# Patient Record
Sex: Female | Born: 1950 | Race: White | Hispanic: No | Marital: Married | Smoking: Never smoker
Health system: Southern US, Community
[De-identification: ages and names within clinical notes are randomized; demographics above are authoritative.]

## PROBLEM LIST (undated history)

## (undated) DIAGNOSIS — J309 Allergic rhinitis, unspecified: Secondary | ICD-10-CM

## (undated) DIAGNOSIS — M069 Rheumatoid arthritis, unspecified: Secondary | ICD-10-CM

## (undated) DIAGNOSIS — K760 Fatty (change of) liver, not elsewhere classified: Secondary | ICD-10-CM

## (undated) DIAGNOSIS — J849 Interstitial pulmonary disease, unspecified: Secondary | ICD-10-CM

## (undated) DIAGNOSIS — K219 Gastro-esophageal reflux disease without esophagitis: Secondary | ICD-10-CM

## (undated) HISTORY — DX: Interstitial pulmonary disease, unspecified: J84.9

## (undated) HISTORY — PX: BREAST SURGERY: SHX581

## (undated) HISTORY — DX: Allergic rhinitis, unspecified: J30.9

## (undated) HISTORY — DX: Fatty (change of) liver, not elsewhere classified: K76.0

## (undated) HISTORY — PX: FOOT SURGERY: SHX648

## (undated) HISTORY — DX: Gastro-esophageal reflux disease without esophagitis: K21.9

## (undated) HISTORY — DX: Rheumatoid arthritis, unspecified: M06.9

---

## 1998-08-20 ENCOUNTER — Other Ambulatory Visit: Admission: RE | Admit: 1998-08-20 | Discharge: 1998-08-20 | Payer: Self-pay | Admitting: *Deleted

## 1999-09-16 ENCOUNTER — Other Ambulatory Visit: Admission: RE | Admit: 1999-09-16 | Discharge: 1999-09-16 | Payer: Self-pay | Admitting: *Deleted

## 2004-06-03 ENCOUNTER — Ambulatory Visit (HOSPITAL_COMMUNITY): Admission: RE | Admit: 2004-06-03 | Discharge: 2004-06-03 | Payer: Self-pay | Admitting: Unknown Physician Specialty

## 2008-03-14 ENCOUNTER — Emergency Department (HOSPITAL_COMMUNITY): Admission: EM | Admit: 2008-03-14 | Discharge: 2008-03-14 | Payer: Self-pay | Admitting: Emergency Medicine

## 2012-08-02 ENCOUNTER — Encounter: Payer: Self-pay | Admitting: Gastroenterology

## 2012-08-29 ENCOUNTER — Ambulatory Visit: Payer: Self-pay | Admitting: Gastroenterology

## 2015-07-15 ENCOUNTER — Emergency Department (HOSPITAL_COMMUNITY): Payer: BLUE CROSS/BLUE SHIELD

## 2015-07-15 ENCOUNTER — Emergency Department (HOSPITAL_COMMUNITY)
Admission: EM | Admit: 2015-07-15 | Discharge: 2015-07-15 | Disposition: A | Discharge status: Home / Self Care | Payer: BLUE CROSS/BLUE SHIELD | Attending: Emergency Medicine | Admitting: Emergency Medicine

## 2015-07-15 ENCOUNTER — Encounter (HOSPITAL_COMMUNITY): Payer: Self-pay | Admitting: Nurse Practitioner

## 2015-07-15 DIAGNOSIS — R0789 Other chest pain: Secondary | ICD-10-CM | POA: Diagnosis not present

## 2015-07-15 DIAGNOSIS — Z79899 Other long term (current) drug therapy: Secondary | ICD-10-CM | POA: Diagnosis not present

## 2015-07-15 DIAGNOSIS — Z7982 Long term (current) use of aspirin: Secondary | ICD-10-CM | POA: Insufficient documentation

## 2015-07-15 DIAGNOSIS — R079 Chest pain, unspecified: Secondary | ICD-10-CM | POA: Diagnosis present

## 2015-07-15 LAB — BASIC METABOLIC PANEL
ANION GAP: 7 (ref 5–15)
BUN: 23 mg/dL — AB (ref 6–20)
CALCIUM: 9.4 mg/dL (ref 8.9–10.3)
CO2: 29 mmol/L (ref 22–32)
Chloride: 102 mmol/L (ref 101–111)
Creatinine, Ser: 1.41 mg/dL — ABNORMAL HIGH (ref 0.44–1.00)
GFR calc Af Amer: 44 mL/min — ABNORMAL LOW (ref >60)
GFR, EST NON AFRICAN AMERICAN: 38 mL/min — AB (ref >60)
GLUCOSE: 108 mg/dL — AB (ref 65–99)
Potassium: 4.3 mmol/L (ref 3.5–5.1)
Sodium: 138 mmol/L (ref 135–145)

## 2015-07-15 LAB — I-STAT TROPONIN, ED
TROPONIN I, POC: 0 ng/mL (ref 0.00–0.08)
Troponin i, poc: 0 ng/mL (ref 0.00–0.08)

## 2015-07-15 LAB — CBC
HCT: 44.1 % (ref 36.0–46.0)
HEMOGLOBIN: 14.8 g/dL (ref 12.0–15.0)
MCH: 30 pg (ref 26.0–34.0)
MCHC: 33.6 g/dL (ref 30.0–36.0)
MCV: 89.5 fL (ref 78.0–100.0)
Platelets: 335 10*3/uL (ref 150–400)
RBC: 4.93 MIL/uL (ref 3.87–5.11)
RDW: 13.5 % (ref 11.5–15.5)
WBC: 10.1 10*3/uL (ref 4.0–10.5)

## 2015-07-15 MED ORDER — NAPROXEN 500 MG PO TABS: 500.0000 mg | ORAL_TABLET | Freq: Two times a day (BID) | ORAL | Status: DC

## 2015-07-15 MED ORDER — IOPAMIDOL (ISOVUE-370) INJECTION 76%
INTRAVENOUS | Status: AC
  Administered 2015-07-15: 100 mL
  Filled 2015-07-15: qty 100

## 2015-07-15 NOTE — ED Notes (Addendum)
Pt c/o 1 day history intermittent L sided CP. She describes as sharp. Pt reports SOB, nausea. Pt denies cough, fevers, lightheadedness. She c/o numbness in L leg and L arm when waking yesterday and today that resolved once she got up from bed. 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 She is alert and breathing easily

## 2015-07-15 NOTE — Discharge Instructions (Signed)
Return to the ED with any concerns including difficulty breathing, fainting, weakness of arms or legs, decreased level of alertness/lethargy or any other alarming symptoms

## 2015-07-15 NOTE — ED Provider Notes (Signed)
CSN: [id number]     Arrival date & time 07/15/15  1328 History   None    Chief Complaint  Patient presents with  . Chest Pain     (Consider location/radiation/quality/duration/timing/severity/associated sxs/prior Treatment) HPI  Pt presenting with c/o chest pain.  Pain is sharp, intermittent and has been going on since yesterday.  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  She states pain lasts approx 5 minutes and then resolves.  Comes back after a few minutes.  No had no diaphoresis, no nausea.  She went home and went to sleep and when she woke up noticed left arm and left leg numbness and tingling, no weakness.  Some shortness of breath with chest pain.  No leg swelling.  She does have hx of DVT in the past.  No recent travel/trauma/surgery.  No fever or cough.  She tried taking advil without much relief.  Pt does not smoke.  There are no other associated systemic symptoms, there are no other alleviating or modifying factors.   History reviewed. No pertinent past medical history. Past Surgical History  Procedure Laterality Date  . Breast surgery     History reviewed. No pertinent family history. Social History  Substance Use Topics  . Smoking status: Never Smoker   . Smokeless tobacco: None  . Alcohol Use: No   OB History    No data available     Review of Systems  ROS reviewed and all otherwise negative except for mentioned in HPI    Allergies  Penicillins  Home Medications   Prior to Admission medications   Medication Sig Start Date End Date Taking? Authorizing Provider  aspirin 81 MG tablet Take 81 mg by mouth daily.   Yes Historical Provider, MD  EST ESTROGENS-METHYLTEST HS 0.625-1.25 MG tablet Take 1 tablet by mouth at bedtime. 05/03/15  Yes Historical Provider, MD  Fish Oil-Cholecalciferol (FISH OIL + D3 PO) Take 1 capsule by mouth daily.   Yes Historical Provider, MD  lisinopril-hydrochlorothiazide  (PRINZIDE,ZESTORETIC) 10-12.5 MG tablet Take 1 tablet by mouth daily. 07/01/15  Yes Historical Provider, MD  PARoxetine (PAXIL) 20 MG tablet Take 40 mg by mouth daily. 07/10/15  Yes Historical Provider, MD  PAZEO 0.7 % SOLN PLACE 1 DROP INTO BOTH EYES ONCE DAILY 06/26/15  Yes Historical Provider, MD  tiZANidine (ZANAFLEX) 4 MG tablet Take 1 tablet by mouth every 8 (eight) hours as needed. Muscle spasm 07/13/15  Yes Historical Provider, MD  traZODone (DESYREL) 50 MG tablet Take 50 mg by mouth daily. 07/01/15  Yes Historical Provider, MD  VITAMIN E PO Take 1,000 Units by mouth daily.   Yes Historical Provider, MD  naproxen (NAPROSYN) 500 MG tablet Take 1 tablet (500 mg total) by mouth 2 (two) times daily. 07/15/15   Alfonzo Beers, MD   BP 102/55 mmHg  Pulse 74  Temp(Src) 98.7 F (37.1 C) (Oral)  Resp 17  SpO2 96%  Vitals reviewed Physical Exam  Physical Examination: General appearance - alert, well appearing, and in no distress Mental status - alert, oriented to person, place, and time Eyes - no conjunctival injection, no scleral icterus Chest - clear to auscultation, no wheezes, rales or rhonchi, symmetric air entry Heart - normal rate, regular rhythm, normal S1, S2, no murmurs, rubs, clicks or gallops Abdomen - soft, nontender, nondistended, no masses or organomegaly Neurological - alert, oriented, normal speech Extremities - peripheral pulses normal, no pedal edema, no clubbing  or cyanosis Skin - normal coloration and turgor, no rashes  ED Course  Procedures (including critical care time) Labs Review Labs Reviewed  BASIC METABOLIC PANEL - Abnormal; Notable for the following:    Glucose, Bld 108 (*)    BUN 23 (*)    Creatinine, Ser 1.41 (*)    GFR calc non Af Amer 38 (*)    GFR calc Af Amer 44 (*)    All other components within normal limits  CBC  I-STAT TROPOININ, ED  Randolm Idol, ED    Imaging Review Dg Chest 2 View  07/15/2015  CLINICAL DATA:  Shortness of breath and chest  pain EXAM: CHEST  2 VIEW COMPARISON:  June 27, 2013 FINDINGS: There is somewhat generalized interstitial thickening, stable. There is no edema or consolidation. The heart size and pulmonary vascularity are normal. No adenopathy. There is atherosclerotic calcification in the aorta. There is degenerative change in the upper lumbar spine. IMPRESSION: Interstitial thickening, a stable finding likely indicative of chronic inflammatory type change. No frank edema or consolidation. Aortic atherosclerosis noted. Electronically Signed   By: Lowella Grip III M.D.   On: 07/15/2015 14:14   Ct Angio Chest Pe W Or Wo Contrast  07/15/2015  CLINICAL DATA:  Left-sided chest pain. Shortness of breath. Nausea. EXAM: CT ANGIOGRAPHY CHEST WITH CONTRAST TECHNIQUE: Multidetector CT imaging of the chest was performed using the standard protocol during bolus administration of intravenous contrast. Multiplanar CT image reconstructions and MIPs were obtained to evaluate the vascular anatomy. CONTRAST:  100 mL Isovue 370 COMPARISON:  None. FINDINGS: Mediastinum/Lymph Nodes: No pulmonary emboli or thoracic aortic dissection identified. The heart size is normal. No masses or pathologically enlarged lymph nodes identified. Lungs/Pleura: Mild bibasilar atelectasis. No focal consolidation or pleural effusion. No pneumothorax. Upper abdomen: No acute findings. Musculoskeletal: No chest wall mass or suspicious bone lesions identified. Other:  Bilateral breast implants are noted. Review of the MIP images confirms the above findings. IMPRESSION: 1. No evidence of pulmonary embolus. Electronically Signed   By: Kathreen Devoid   On: 07/15/2015 17:35   I have personally reviewed and evaluated these images and lab results as part of my medical decision-making.   EKG Interpretation   Date/Time:  Thursday July 15 2015 13:31:56 EDT Ventricular Rate:  96 PR Interval:  136 QRS Duration: 72 QT Interval:  360 QTC Calculation: 454 R Axis:    37 Text Interpretation:  Normal sinus rhythm Normal ECG No old tracing to  compare Confirmed by [facility]  MD, Jerrell Mangel [phone]) on 07/15/2015 3:41:03 PM      MDM   Final diagnoses:  Chest wall pain    Pt presenting with c/o sharp left sided chest pain, not associated with exertion, no nausea or diaphoresis.  She has a heart score of 2, making her low risk for ACS.  2 sets of troponins were negative, ekg reassuring.  Pain is also reproducible which is most c/w inflammation of chest wall.  Pt does have a hx of DVT so Ct angio chest was obtained this did not show any evidence of PE.  Pt given rx for anti inflammatories.  Discharged with strict return precautions.  Pt agreeable with plan.  Pt has a heart score of 2  Alfonzo Beers, MD 07/15/15 2229

## 2015-07-15 NOTE — ED Notes (Signed)
Chest pain is worse on palpation

## 2017-01-17 DIAGNOSIS — Z6829 Body mass index (BMI) 29.0-29.9, adult: Secondary | ICD-10-CM | POA: Diagnosis not present

## 2017-01-17 DIAGNOSIS — I10 Essential (primary) hypertension: Secondary | ICD-10-CM | POA: Diagnosis not present

## 2017-01-17 DIAGNOSIS — Z87891 Personal history of nicotine dependence: Secondary | ICD-10-CM | POA: Diagnosis not present

## 2017-01-17 DIAGNOSIS — J069 Acute upper respiratory infection, unspecified: Secondary | ICD-10-CM | POA: Diagnosis not present

## 2017-01-17 DIAGNOSIS — E7800 Pure hypercholesterolemia, unspecified: Secondary | ICD-10-CM | POA: Diagnosis not present

## 2017-01-17 DIAGNOSIS — Z299 Encounter for prophylactic measures, unspecified: Secondary | ICD-10-CM | POA: Diagnosis not present

## 2017-01-17 DIAGNOSIS — M069 Rheumatoid arthritis, unspecified: Secondary | ICD-10-CM | POA: Diagnosis not present

## 2017-02-28 DIAGNOSIS — J069 Acute upper respiratory infection, unspecified: Secondary | ICD-10-CM | POA: Diagnosis not present

## 2017-02-28 DIAGNOSIS — Z299 Encounter for prophylactic measures, unspecified: Secondary | ICD-10-CM | POA: Diagnosis not present

## 2017-02-28 DIAGNOSIS — E7800 Pure hypercholesterolemia, unspecified: Secondary | ICD-10-CM | POA: Diagnosis not present

## 2017-02-28 DIAGNOSIS — Z6828 Body mass index (BMI) 28.0-28.9, adult: Secondary | ICD-10-CM | POA: Diagnosis not present

## 2017-02-28 DIAGNOSIS — I10 Essential (primary) hypertension: Secondary | ICD-10-CM | POA: Diagnosis not present

## 2017-02-28 DIAGNOSIS — M069 Rheumatoid arthritis, unspecified: Secondary | ICD-10-CM | POA: Diagnosis not present

## 2017-04-26 ENCOUNTER — Ambulatory Visit (INDEPENDENT_AMBULATORY_CARE_PROVIDER_SITE_OTHER): Payer: Medicare HMO | Admitting: Otolaryngology

## 2017-04-26 DIAGNOSIS — R490 Dysphonia: Secondary | ICD-10-CM | POA: Diagnosis not present

## 2017-04-26 DIAGNOSIS — K219 Gastro-esophageal reflux disease without esophagitis: Secondary | ICD-10-CM | POA: Diagnosis not present

## 2017-05-25 ENCOUNTER — Ambulatory Visit: Payer: Medicare HMO | Admitting: Pulmonary Disease

## 2017-05-25 ENCOUNTER — Encounter: Payer: Self-pay | Admitting: Pulmonary Disease

## 2017-05-25 VITALS — BP 124/68 | HR 82 | Ht 61.0 in | Wt 145.0 lb

## 2017-05-25 DIAGNOSIS — J849 Interstitial pulmonary disease, unspecified: Secondary | ICD-10-CM | POA: Diagnosis not present

## 2017-05-25 DIAGNOSIS — R0602 Shortness of breath: Secondary | ICD-10-CM

## 2017-05-25 NOTE — Patient Instructions (Signed)
We will schedule you for full pulmonary function test and 6-minute walk test I will try to get some more information from Dr. Manuella Ghazi and will await their evaluation from rheumatology regarding your rheumatoid arthritis Follow-up in 1 to 2 months

## 2017-05-25 NOTE — Progress Notes (Signed)
Kimberly Livingston    [id number]    1950/07/03  Primary Care Physician:Shah, Weldon Picking, MD  Referring Physician: Monico Blitz, [address] [place] [place], [place] [address]  Chief complaint: Consult for interstitial lung disease  HPI: 67 year old with history of fatty liver, GERD, hypertension.  Has complains of cough for the past 1 to 2 months.  Symptoms associated with white mucus production, postnasal drip.  Associated with dyspnea on exertion.  Denies symptoms at rest.  No wheezing, fevers, chills, hemoptysis. History noted for swelling, joint pain of metacarpophalangeal and proximal interphalangeal joint associated with morning stiffness which lasts for 1 to 2 hours.  She has seen her primary care physician Dr. Manuella Ghazi and was told that she has rheumatoid arthritis after specific labs were sent.  She has been referred to Dr. Rennis Chris, Rheumatology.  Given prednisone at last office visit in April 2019. Denies any Raynaud's symptoms, rash.  She has difficulty swallowing occasionally and feels that food gets stuck in her esophagus. CT scan noted for progressive interstitial lung disease and she has been referred to pulmonary for further evaluation.  Pets: Has a dog.  Previously had a cockatiel bird 15 years ago.  No farm animals. Occupation: Worked at [employer] for [employer] Exposures: No mold, dampness, hot tub, Jacuzzi Smoking history: No smoking history.  She had been exposed to secondhand smoke Travel history: Lived in [place] and [place] Relevant family history: Significant family history of rheumatoid arthritis.  No family history of lung disease.  Outpatient Encounter Medications as of 05/25/2017  Medication Sig  . albuterol (PROVENTIL) (2.5 MG/3ML) 0.083% nebulizer solution Take 2.5 mg by nebulization every 6 (six) hours as needed for wheezing or shortness of breath.  . dextromethorphan-guaiFENesin (MUCINEX DM) 30-600 MG 12hr tablet Take 1 tablet by mouth 2 (two) times  daily.  . fluticasone (FLONASE) 50 MCG/ACT nasal spray Place 2 sprays into both nostrils daily.  Marland Kitchen omeprazole (PRILOSEC) 20 MG capsule Take 20 mg by mouth daily.  Marland Kitchen PARoxetine (PAXIL) 20 MG tablet Take 40 mg by mouth daily.  Marland Kitchen PAZEO 0.7 % SOLN PLACE 1 DROP INTO BOTH EYES ONCE DAILY  . traZODone (DESYREL) 50 MG tablet Take 50 mg by mouth daily.  Marland Kitchen VITAMIN E PO Take 1,000 Units by mouth daily.  . [DISCONTINUED] aspirin 81 MG tablet Take 81 mg by mouth daily.  . [DISCONTINUED] EST ESTROGENS-METHYLTEST HS 0.625-1.25 MG tablet Take 1 tablet by mouth at bedtime.  . [DISCONTINUED] Fish Oil-Cholecalciferol (FISH OIL + D3 PO) Take 1 capsule by mouth daily.  . [DISCONTINUED] lisinopril-hydrochlorothiazide (PRINZIDE,ZESTORETIC) 10-12.5 MG tablet Take 1 tablet by mouth daily.  . [DISCONTINUED] naproxen (NAPROSYN) 500 MG tablet Take 1 tablet (500 mg total) by mouth 2 (two) times daily.  . [DISCONTINUED] tiZANidine (ZANAFLEX) 4 MG tablet Take 1 tablet by mouth every 8 (eight) hours as needed. Muscle spasm   No facility-administered encounter medications on file as of 05/25/2017.     Allergies as of 05/25/2017 - Review Complete 05/25/2017  Allergen Reaction Noted  . Penicillins Rash 07/15/2015    Past Medical History:  Diagnosis Date  . Allergic rhinitis     Past Surgical History:  Procedure Laterality Date  . BREAST SURGERY    . FOOT SURGERY      Family History  Problem Relation Age of Onset  . Stroke Mother   . Heart disease Father   . Allergic rhinitis Sister   . Allergic rhinitis Brother   .  Heart attack Brother     Social History   Socioeconomic History  . Marital status: Married    Spouse name: Not on file  . Number of children: Not on file  . Years of education: Not on file  . Highest education level: Not on file  Occupational History  . Not on file  Social Needs  . Financial resource strain: Not on file  . Food insecurity:    Worry: Not on file    Inability: Not on  file  . Transportation needs:    Medical: Not on file    Non-medical: Not on file  Tobacco Use  . Smoking status: Never Smoker  . Smokeless tobacco: Never Used  Substance and Sexual Activity  . Alcohol use: No  . Drug use: No  . Sexual activity: Not on file  Lifestyle  . Physical activity:    Days per week: Not on file    Minutes per session: Not on file  . Stress: Not on file  Relationships  . Social connections:    Talks on phone: Not on file    Gets together: Not on file    Attends religious service: Not on file    Active member of club or organization: Not on file    Attends meetings of clubs or organizations: Not on file    Relationship status: Not on file  . Intimate partner violence:    Fear of current or ex partner: Not on file    Emotionally abused: Not on file    Physically abused: Not on file    Forced sexual activity: Not on file  Other Topics Concern  . Not on file  Social History Narrative  . Not on file    Review of systems: Review of Systems  Constitutional: Negative for fever and chills.  HENT: Negative.   Eyes: Negative for blurred vision.  Respiratory: as per HPI  Cardiovascular: Negative for chest pain and palpitations.  Gastrointestinal: Negative for vomiting, diarrhea, blood per rectum. Genitourinary: Negative for dysuria, urgency, frequency and hematuria.  Musculoskeletal: Negative for myalgias, back pain and joint pain.  Skin: Negative for itching and rash.  Neurological: Negative for dizziness, tremors, focal weakness, seizures and loss of consciousness.  Endo/Heme/Allergies: Negative for environmental allergies.  Psychiatric/Behavioral: Negative for depression, suicidal ideas and hallucinations.  All other systems reviewed and are negative.  Physical Exam: Blood pressure 124/68, pulse 82, height 5\' 1"  (1.549 m), weight 145 lb (65.8 kg), SpO2 97 %. Gen:      No acute distress HEENT:  EOMI, sclera anicteric Neck:     No masses; no  thyromegaly Lungs:   Basal crackles CV:         Regular rate and rhythm; no murmurs Abd:      + bowel sounds; soft, non-tender; no palpable masses, no distension Ext:    No edema; adequate peripheral perfusion Skin:      Warm and dry; no rash Neuro: alert and oriented x 3 Psych: normal mood and affect  Data Reviewed: CT chest 06/03/2004-minimal basilar opacities CT chest 07/15/2015- bibasilar groundglass opacities, reticulation CT chest 04/30/2017- progression of subpleural reticulation, groundglass opacities, traction bronchiectasis.  No definite honeycombing.  There is bibasilar predominance I reviewed the images personally  Labs from primary care  04/23/2017 WBC 13.5, hemoglobin 11.3, hematocrit 35.2, platelets 525, eos 11%, absolute eosinophil count 5462 Metabolic panel-BUN/creatinine 23/1.48, panel within normal limits.  Assessment:  Evaluation for interstitial lung disease CT scan reviewed which shows progression  of basal fibrosis with no definite honeycombing.  This looks like fibrotic NSIP.  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   We will await evaluation from rheumatology.  Get connective tissue serology labs from Dr. Manuella Ghazi.  If this does not turn out to be rheumatoid arthritis then we may need to do further evaluation with lung biopsy. Schedule for pulmonary function test and 6-minute walk test.  GERD Symptoms appear well controlled on PPI.  Plan/Recommendations: - Pulmonary function test, 6-minute walk test - Get labs from primary care. - Await evaluation from rheumatology.  Marshell Garfinkel MD [facility] 05/25/2017, 9:16 AM  CC: Monico Blitz, MD

## 2017-05-26 DIAGNOSIS — J849 Interstitial pulmonary disease, unspecified: Secondary | ICD-10-CM | POA: Insufficient documentation

## 2017-06-07 ENCOUNTER — Ambulatory Visit (INDEPENDENT_AMBULATORY_CARE_PROVIDER_SITE_OTHER): Payer: Medicare HMO | Admitting: Otolaryngology

## 2017-06-29 ENCOUNTER — Ambulatory Visit (INDEPENDENT_AMBULATORY_CARE_PROVIDER_SITE_OTHER): Payer: Medicare HMO | Admitting: *Deleted

## 2017-06-29 DIAGNOSIS — R0609 Other forms of dyspnea: Secondary | ICD-10-CM | POA: Diagnosis not present

## 2017-06-29 NOTE — Progress Notes (Signed)
SIX MIN WALK 06/29/2017  Medications Prilosec 20 and Trazodone 50mg  taken at 7:30a  Supplimental Oxygen during Test? (L/min) No  Laps 4  Partial Lap (in Meters) 0  Baseline BP (sitting) 122/70  Baseline Heartrate 91  Baseline Dyspnea (Borg Scale) 3  Baseline Fatigue (Borg Scale) 1  Baseline SPO2 98  BP (sitting) 140/78  Heartrate 106  Dyspnea (Borg Scale) 3  Fatigue (Borg Scale) 1  SPO2 100  BP (sitting) 132/72  Heartrate 94  SPO2 100  Interpretation Leg pain  Distance Completed 192  Tech Comments: pt completed test at slow pace with no desats. c/o leg pain due to Arthritis

## 2017-07-06 ENCOUNTER — Ambulatory Visit (INDEPENDENT_AMBULATORY_CARE_PROVIDER_SITE_OTHER): Payer: Medicare HMO | Admitting: Pulmonary Disease

## 2017-07-06 ENCOUNTER — Encounter: Payer: Self-pay | Admitting: Pulmonary Disease

## 2017-07-06 VITALS — BP 128/82 | HR 87 | Ht 60.24 in | Wt 153.0 lb

## 2017-07-06 DIAGNOSIS — J849 Interstitial pulmonary disease, unspecified: Secondary | ICD-10-CM | POA: Diagnosis not present

## 2017-07-06 DIAGNOSIS — M069 Rheumatoid arthritis, unspecified: Secondary | ICD-10-CM | POA: Diagnosis not present

## 2017-07-06 LAB — PULMONARY FUNCTION TEST
DL/VA % pred: 88 %
DL/VA: 3.79 ml/min/mmHg/L
DLCO unc % pred: 51 %
DLCO unc: 9.8 ml/min/mmHg
FEF 25-75 Post: 3.52 L/sec
FEF 25-75 Pre: 2.26 L/sec
FEF2575-%CHANGE-POST: 55 %
FEF2575-%PRED-POST: 199 %
FEF2575-%PRED-PRE: 128 %
FEV1-%Change-Post: 1 %
FEV1-%PRED-POST: 91 %
FEV1-%PRED-PRE: 89 %
FEV1-PRE: 1.77 L
FEV1-Post: 1.81 L
FEV1FVC-%CHANGE-POST: 2 %
FEV1FVC-%Pred-Pre: 121 %
FEV6-%Change-Post: 0 %
FEV6-%PRED-PRE: 77 %
FEV6-%Pred-Post: 77 %
FEV6-PRE: 1.93 L
FEV6-Post: 1.91 L
FEV6FVC-%Change-Post: 0 %
FEV6FVC-%PRED-PRE: 105 %
FEV6FVC-%Pred-Post: 105 %
FVC-%Change-Post: 0 %
FVC-%PRED-PRE: 74 %
FVC-%Pred-Post: 73 %
FVC-POST: 1.91 L
FVC-PRE: 1.93 L
POST FEV1/FVC RATIO: 94 %
POST FEV6/FVC RATIO: 100 %
Pre FEV1/FVC ratio: 92 %
Pre FEV6/FVC Ratio: 100 %
RV % PRED: 50 %
RV: 1 L
TLC % pred: 65 %
TLC: 2.92 L

## 2017-07-06 MED ORDER — ALBUTEROL SULFATE HFA 108 (90 BASE) MCG/ACT IN AERS: 2.0000 | INHALATION_SPRAY | Freq: Four times a day (QID) | RESPIRATORY_TRACT | 2 refills | Status: DC | PRN

## 2017-07-06 NOTE — Progress Notes (Addendum)
Kimberly Livingston    [id number]    1950/07/16  Primary Care Physician:Shah, Weldon Picking, MD  Referring Physician: Monico Blitz, [address] [place], [place] [address]  Chief complaint:  Follow up for RA- ILD  HPI: 67 year old with history of fatty liver, GERD, hypertension.  Has complains of cough for the past 1 to 2 months.  Symptoms associated with white mucus production, postnasal drip.  Associated with dyspnea on exertion.  Denies symptoms at rest.  No wheezing, fevers, chills, hemoptysis. History noted for swelling, joint pain of metacarpophalangeal and proximal interphalangeal joint associated with morning stiffness which lasts for 1 to 2 hours.  She has seen her primary care physician Dr. Manuella Ghazi and was told that she has rheumatoid arthritis after specific labs were sent.  She has been referred to Dr. Rennis Chris, Rheumatology.  Given prednisone at last office visit in April 2019. Denies any Raynaud's symptoms, rash.  She has difficulty swallowing occasionally and feels that food gets stuck in her esophagus. CT scan noted for progressive interstitial lung disease and she has been referred to pulmonary for further evaluation.  Pets: Has a dog.  Previously had a cockatiel bird 15 years ago.  No farm animals. Occupation: Worked at [employer] for [employer] Exposures: No mold, dampness, hot tub, Jacuzzi Smoking history: No smoking history.  She had been exposed to secondhand smoke Travel history: Lived in [place] and [place] Relevant family history: Significant family history of rheumatoid arthritis.  No family history of lung disease.  Interim history: Seen by Dr. Estanislado Pandy and started on methotrexate for rheumatoid arthritis. Complains of dyspnea on exertion.  Denies any cough, sputum production.   Outpatient Encounter Medications as of 07/06/2017  Medication Sig  . methotrexate (RHEUMATREX) 2.5 MG tablet Take 10 mg by mouth once a week. Caution:Chemotherapy. Protect  from light.  Marland Kitchen omeprazole (PRILOSEC) 20 MG capsule Take 20 mg by mouth daily.  Marland Kitchen PARoxetine (PAXIL) 20 MG tablet Take 40 mg by mouth daily.  Marland Kitchen PAZEO 0.7 % SOLN PLACE 1 DROP INTO BOTH EYES ONCE DAILY  . predniSONE (DELTASONE) 5 MG tablet Take 5-15 mg by mouth daily with breakfast.  . traZODone (DESYREL) 50 MG tablet Take 50 mg by mouth daily.  Marland Kitchen albuterol (PROVENTIL) (2.5 MG/3ML) 0.083% nebulizer solution Take 2.5 mg by nebulization every 6 (six) hours as needed for wheezing or shortness of breath.  . dextromethorphan-guaiFENesin (MUCINEX DM) 30-600 MG 12hr tablet Take 1 tablet by mouth 2 (two) times daily.  Marland Kitchen VITAMIN E PO Take 1,000 Units by mouth daily.  . [DISCONTINUED] fluticasone (FLONASE) 50 MCG/ACT nasal spray Place 2 sprays into both nostrils daily.   No facility-administered encounter medications on file as of 07/06/2017.     Allergies as of 07/06/2017 - Review Complete 07/06/2017  Allergen Reaction Noted  . Penicillins Rash 07/15/2015    Past Medical History:  Diagnosis Date  . Allergic rhinitis     Past Surgical History:  Procedure Laterality Date  . BREAST SURGERY    . FOOT SURGERY      Family History  Problem Relation Age of Onset  . Stroke Mother   . Heart disease Father   . Allergic rhinitis Sister   . Allergic rhinitis Brother   . Heart attack Brother     Social History   Socioeconomic History  . Marital status: Married    Spouse name: Not on file  . Number of children: Not on file  .  Years of education: Not on file  . Highest education level: Not on file  Occupational History  . Not on file  Social Needs  . Financial resource strain: Not on file  . Food insecurity:    Worry: Not on file    Inability: Not on file  . Transportation needs:    Medical: Not on file    Non-medical: Not on file  Tobacco Use  . Smoking status: Never Smoker  . Smokeless tobacco: Never Used  Substance and Sexual Activity  . Alcohol use: No  . Drug use: No  . Sexual  activity: Not on file  Lifestyle  . Physical activity:    Days per week: Not on file    Minutes per session: Not on file  . Stress: Not on file  Relationships  . Social connections:    Talks on phone: Not on file    Gets together: Not on file    Attends religious service: Not on file    Active member of club or organization: Not on file    Attends meetings of clubs or organizations: Not on file    Relationship status: Not on file  . Intimate partner violence:    Fear of current or ex partner: Not on file    Emotionally abused: Not on file    Physically abused: Not on file    Forced sexual activity: Not on file  Other Topics Concern  . Not on file  Social History Narrative  . Not on file    Review of systems: Review of Systems  Constitutional: Negative for fever and chills.  HENT: Negative.   Eyes: Negative for blurred vision.  Respiratory: as per HPI  Cardiovascular: Negative for chest pain and palpitations.  Gastrointestinal: Negative for vomiting, diarrhea, blood per rectum. Genitourinary: Negative for dysuria, urgency, frequency and hematuria.  Musculoskeletal: Negative for myalgias, back pain and joint pain.  Skin: Negative for itching and rash.  Neurological: Negative for dizziness, tremors, focal weakness, seizures and loss of consciousness.  Endo/Heme/Allergies: Negative for environmental allergies.  Psychiatric/Behavioral: Negative for depression, suicidal ideas and hallucinations.  All other systems reviewed and are negative.  Physical Exam: Blood pressure 128/82, pulse 87, height 5' 0.24" (1.53 m), weight 153 lb (69.4 kg), SpO2 98 %. Gen:      No acute distress HEENT:  EOMI, sclera anicteric Neck:     No masses; no thyromegaly Lungs:  Basal crackles CV:         Regular rate and rhythm; no murmurs Abd:      + bowel sounds; soft, non-tender; no palpable masses, no distension Ext:    No edema; adequate peripheral perfusion Skin:      Warm and dry; no  rash Neuro: alert and oriented x 3 Psych: normal mood and affect  Data Reviewed: CT chest 06/03/2004-minimal basilar opacities CT chest 07/15/2015- bibasilar groundglass opacities, reticulation CT chest 04/30/2017- progression of subpleural reticulation, groundglass opacities, traction bronchiectasis.  No definite honeycombing.  There is bibasilar predominance I reviewed the images personally  Labs from primary care  04/23/2017 WBC 13.5, hemoglobin 11.3, hematocrit 35.2, platelets 525, eos 11%, absolute eosinophil count 5361 Metabolic panel-BUN/creatinine 23/1.48, panel within normal limits.  PFTs 07/06/1988 FVC 1.91 [73%], FEV1 1.81 [91%], F/F 94, TLC 65%, DLCO 51% Moderate restriction, severe diffusion defect.  6-minute walk test 07/06/1988 192 m,  post walk heart rate, O2 sat-94, 100%.  Assessment:  RA-ILD CT scan shows progression of basal fibrosis with no definite honeycombing.  This looks like fibrotic NSIP.  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   PFTs reviewed with restriction and diffusion impairment consistent with interstitial lung disease.  We will continue to monitor this Continue albuterol as needed  GERD Symptoms are well controlled on PPI  Plan/Recommendations: - Follow up Pulmonary function test, 6-minute walk test   Marshell Garfinkel MD [facility] 07/06/2017, 2:06 PM  CC: Monico Blitz, MD

## 2017-07-06 NOTE — Patient Instructions (Signed)
Continue on albuterol.  Will order inhaler to be used as needed Continue on Prilosec for acid reflux Follow-up in 6 months with repeat spirometry, diffusion capacity and 6-minute walk test.

## 2017-07-06 NOTE — Progress Notes (Signed)
PFT completed today. 07/06/17

## 2017-09-03 ENCOUNTER — Telehealth: Payer: Self-pay | Admitting: Pulmonary Disease

## 2017-09-03 NOTE — Telephone Encounter (Signed)
Called patient, unable to reach left message to give us a call back. 

## 2017-09-04 NOTE — Telephone Encounter (Signed)
Attempted to call pt. I did not receive an answer. I have left a message for pt to return our call.  

## 2017-09-05 ENCOUNTER — Ambulatory Visit: Payer: Medicare HMO

## 2017-09-05 NOTE — Telephone Encounter (Signed)
We were calling to remind pt of her 6MW appointment. This appointment has been canceled per the pt. Nothing further was needed at this time.

## 2018-01-23 DIAGNOSIS — E663 Overweight: Secondary | ICD-10-CM | POA: Diagnosis not present

## 2018-01-23 DIAGNOSIS — Z79899 Other long term (current) drug therapy: Secondary | ICD-10-CM | POA: Diagnosis not present

## 2018-01-23 DIAGNOSIS — M545 Low back pain: Secondary | ICD-10-CM | POA: Diagnosis not present

## 2018-01-23 DIAGNOSIS — M2550 Pain in unspecified joint: Secondary | ICD-10-CM | POA: Diagnosis not present

## 2018-01-23 DIAGNOSIS — Z6827 Body mass index (BMI) 27.0-27.9, adult: Secondary | ICD-10-CM | POA: Diagnosis not present

## 2018-01-23 DIAGNOSIS — M0579 Rheumatoid arthritis with rheumatoid factor of multiple sites without organ or systems involvement: Secondary | ICD-10-CM | POA: Diagnosis not present

## 2018-02-11 DIAGNOSIS — R69 Illness, unspecified: Secondary | ICD-10-CM | POA: Diagnosis not present

## 2018-02-13 DIAGNOSIS — R69 Illness, unspecified: Secondary | ICD-10-CM | POA: Diagnosis not present

## 2018-02-13 DIAGNOSIS — L602 Onychogryphosis: Secondary | ICD-10-CM | POA: Diagnosis not present

## 2018-02-13 DIAGNOSIS — Z5189 Encounter for other specified aftercare: Secondary | ICD-10-CM | POA: Diagnosis not present

## 2018-02-26 DIAGNOSIS — M0579 Rheumatoid arthritis with rheumatoid factor of multiple sites without organ or systems involvement: Secondary | ICD-10-CM | POA: Diagnosis not present

## 2018-04-08 DIAGNOSIS — M2550 Pain in unspecified joint: Secondary | ICD-10-CM | POA: Diagnosis not present

## 2018-04-08 DIAGNOSIS — Z79899 Other long term (current) drug therapy: Secondary | ICD-10-CM | POA: Diagnosis not present

## 2018-04-08 DIAGNOSIS — M0579 Rheumatoid arthritis with rheumatoid factor of multiple sites without organ or systems involvement: Secondary | ICD-10-CM | POA: Diagnosis not present

## 2018-04-22 DIAGNOSIS — Z6827 Body mass index (BMI) 27.0-27.9, adult: Secondary | ICD-10-CM | POA: Diagnosis not present

## 2018-04-22 DIAGNOSIS — I10 Essential (primary) hypertension: Secondary | ICD-10-CM | POA: Diagnosis not present

## 2018-04-22 DIAGNOSIS — E7800 Pure hypercholesterolemia, unspecified: Secondary | ICD-10-CM | POA: Diagnosis not present

## 2018-04-22 DIAGNOSIS — K219 Gastro-esophageal reflux disease without esophagitis: Secondary | ICD-10-CM | POA: Diagnosis not present

## 2018-04-22 DIAGNOSIS — M069 Rheumatoid arthritis, unspecified: Secondary | ICD-10-CM | POA: Diagnosis not present

## 2018-04-22 DIAGNOSIS — J019 Acute sinusitis, unspecified: Secondary | ICD-10-CM | POA: Diagnosis not present

## 2018-04-22 DIAGNOSIS — Z299 Encounter for prophylactic measures, unspecified: Secondary | ICD-10-CM | POA: Diagnosis not present

## 2018-07-08 DIAGNOSIS — I10 Essential (primary) hypertension: Secondary | ICD-10-CM | POA: Diagnosis not present

## 2018-07-08 DIAGNOSIS — Z6832 Body mass index (BMI) 32.0-32.9, adult: Secondary | ICD-10-CM | POA: Diagnosis not present

## 2018-07-08 DIAGNOSIS — Z299 Encounter for prophylactic measures, unspecified: Secondary | ICD-10-CM | POA: Diagnosis not present

## 2018-07-08 DIAGNOSIS — K219 Gastro-esophageal reflux disease without esophagitis: Secondary | ICD-10-CM | POA: Diagnosis not present

## 2018-07-08 DIAGNOSIS — Z789 Other specified health status: Secondary | ICD-10-CM | POA: Diagnosis not present

## 2018-07-09 DIAGNOSIS — Z79899 Other long term (current) drug therapy: Secondary | ICD-10-CM | POA: Diagnosis not present

## 2018-07-09 DIAGNOSIS — M2550 Pain in unspecified joint: Secondary | ICD-10-CM | POA: Diagnosis not present

## 2018-07-09 DIAGNOSIS — M545 Low back pain: Secondary | ICD-10-CM | POA: Diagnosis not present

## 2018-07-09 DIAGNOSIS — M0579 Rheumatoid arthritis with rheumatoid factor of multiple sites without organ or systems involvement: Secondary | ICD-10-CM | POA: Diagnosis not present

## 2018-07-17 ENCOUNTER — Encounter: Payer: Self-pay | Admitting: Gastroenterology

## 2018-08-06 DIAGNOSIS — M0579 Rheumatoid arthritis with rheumatoid factor of multiple sites without organ or systems involvement: Secondary | ICD-10-CM | POA: Diagnosis not present

## 2018-08-07 DIAGNOSIS — E8881 Metabolic syndrome: Secondary | ICD-10-CM | POA: Diagnosis not present

## 2018-08-07 DIAGNOSIS — Z299 Encounter for prophylactic measures, unspecified: Secondary | ICD-10-CM | POA: Diagnosis not present

## 2018-08-07 DIAGNOSIS — K219 Gastro-esophageal reflux disease without esophagitis: Secondary | ICD-10-CM | POA: Diagnosis not present

## 2018-08-07 DIAGNOSIS — I10 Essential (primary) hypertension: Secondary | ICD-10-CM | POA: Diagnosis not present

## 2018-08-07 DIAGNOSIS — M069 Rheumatoid arthritis, unspecified: Secondary | ICD-10-CM | POA: Diagnosis not present

## 2018-08-07 DIAGNOSIS — Z6832 Body mass index (BMI) 32.0-32.9, adult: Secondary | ICD-10-CM | POA: Diagnosis not present

## 2018-08-15 DIAGNOSIS — R69 Illness, unspecified: Secondary | ICD-10-CM | POA: Diagnosis not present

## 2018-08-22 ENCOUNTER — Ambulatory Visit: Payer: Medicare HMO | Admitting: Gastroenterology

## 2018-09-25 ENCOUNTER — Encounter: Payer: Self-pay | Admitting: Gastroenterology

## 2018-09-25 ENCOUNTER — Ambulatory Visit: Payer: Medicare HMO | Admitting: Gastroenterology

## 2018-09-25 NOTE — Progress Notes (Addendum)
REVIEWED-NO ADDITIONAL RECOMMENDATIONS.Marland Kitchen  Referring Provider: Monico Blitz, MD Primary Care Physician:  Monico Blitz, MD Primary Gastroenterologist:  Dr. Oneida Alar  Chief Complaint  Patient presents with  . Gastroesophageal Reflux    burning in chest/throat, nausea at times    HPI:   Kimberly Livingston is a 68 y.o. female presenting today at the request of Dr. Monico Blitz for GERD.   Today she states she has had reflux symptoms for about 3-4 years. Will have burning in her throat and chest. Feels acid coming up in her throat at times. Taking Protonix she may go all day without symptoms, but at night time she will get reflux and heartburn. Is taking tums at night which will relieve the reflux. Eats a light meal in the evening. Doesn't eat spicy, fried, or fatty foods. Bakes her foods. Sometimes hamburger will trigger symptoms. Other days she will have symptoms regardless of what she eats. Started Protonix 2-3 months ago. Had been on omeprazole 20 mg for about 6 months prior to that. Feels that omeprazole helped her symptoms more than the Protonix does. Would still have some breakthrough symptoms at night, but less frequently. Some nausea with reflux. No vomiting.No unintentional weight loss. Mentions some vague symptoms of dysphagia occurring maybe twice a month for the last several months. Not worsening. Will hang for a shot time then go on down. Notices with breads and crackers. No pill dysphagia. No liquid dysphagia. Not interested in EGD with dilation at this time.   No blood in the stool or black stools. Taking Aleve maybe once or twice a month. No Goody's powders. Has not tried tylenol.   No abdominal pain. BMs daily. Occasional constipation with straining. No diarrhea.   No fever, chills, lightheadedness, dizziness, or pre-syncope. No chest pain, palpitations. Occasional  shortness of breath with exertion. No shortness of breath at rest. Chronic intermittent cough.   Last colonoscopy  about 6 months ago at [facility]. Reports poor prep. States she is supposed to go back again. Will request records.  EGD years ago for acid reflux. States she was ok.    Past Medical History:  Diagnosis Date  . Allergic rhinitis   . Fatty liver   . ILD (interstitial lung disease) ([place])   . Rheumatoid arthritis [facility])     Past Surgical History:  Procedure Laterality Date  . BREAST SURGERY    . FOOT SURGERY      Current Outpatient Medications  Medication Sig Dispense Refill  . albuterol (PROVENTIL HFA;VENTOLIN HFA) 108 (90 Base) MCG/ACT inhaler Inhale 2 puffs into the lungs every 6 (six) hours as needed for wheezing or shortness of breath. 1 Inhaler 2  . albuterol (PROVENTIL) (2.5 MG/3ML) 0.083% nebulizer solution Take 2.5 mg by nebulization every 6 (six) hours as needed for wheezing or shortness of breath.    . calcium carbonate (TUMS - DOSED IN MG ELEMENTAL CALCIUM) 500 MG chewable tablet Chew 1 tablet by mouth at bedtime.    Marland Kitchen dextromethorphan-guaiFENesin (MUCINEX DM) 30-600 MG 12hr tablet Take 1 tablet by mouth 2 (two) times daily.    . folic acid (FOLVITE) 1 MG tablet Take 2 mg by mouth daily.    . methotrexate (RHEUMATREX) 2.5 MG tablet 9 tablets daily    . pantoprazole (PROTONIX) 40 MG tablet Take 40 mg by mouth daily.    Marland Kitchen PAZEO 0.7 % SOLN PLACE 1 DROP INTO BOTH EYES ONCE DAILY  6  . predniSONE (DELTASONE) 5 MG tablet Take 5-15 mg by  mouth daily with breakfast.    . traZODone (DESYREL) 50 MG tablet Take 50 mg by mouth daily.  2  . VITAMIN E PO Take 1,000 Units by mouth daily. When she remembers    . famotidine (PEPCID) 20 MG tablet Take 1 tablet (20 mg total) by mouth at bedtime. 30 tablet 4  . omeprazole (PRILOSEC) 40 MG capsule Take 1 capsule (40 mg total) by mouth daily. 30 capsule 4   No current facility-administered medications for this visit.     Allergies as of 09/26/2018 - Review Complete 09/26/2018  Allergen Reaction Noted  . Penicillins Rash 07/15/2015    Family  History  Problem Relation Age of Onset  . Stroke Mother   . Heart disease Father   . Allergic rhinitis Sister   . Allergic rhinitis Brother   . Heart attack Brother   . Colon cancer Neg Hx   . Colon polyps Neg Hx     Social History   Socioeconomic History  . Marital status: Married    Spouse name: Not on file  . Number of children: Not on file  . Years of education: Not on file  . Highest education level: Not on file  Occupational History  . Not on file  Social Needs  . Financial resource strain: Not on file  . Food insecurity    Worry: Not on file    Inability: Not on file  . Transportation needs    Medical: Not on file    Non-medical: Not on file  Tobacco Use  . Smoking status: Never Smoker  . Smokeless tobacco: Never Used  Substance and Sexual Activity  . Alcohol use: No  . Drug use: No  . Sexual activity: Not on file  Lifestyle  . Physical activity    Days per week: Not on file    Minutes per session: Not on file  . Stress: Not on file  Relationships  . Social Herbalist on phone: Not on file    Gets together: Not on file    Attends religious service: Not on file    Active member of club or organization: Not on file    Attends meetings of clubs or organizations: Not on file    Relationship status: Not on file  . Intimate partner violence    Fear of current or ex partner: Not on file    Emotionally abused: Not on file    Physically abused: Not on file    Forced sexual activity: Not on file  Other Topics Concern  . Not on file  Social History Narrative  . Not on file    Review of Systems: Gen: See HPI CV: See HPI  Resp: See HPI GI: See HPI GU : Denies urinary burning, urinary frequency, urinary hesitancy MS: Has Rheumatoid Arthritis and hurts all over.  Derm: Denies rash Psych: Denies depression, anxiety Heme: Denies bruising, bleeding  Physical Exam: BP (!) 147/79   Pulse (!) 108   Temp (!) 97.1 F (36.2 C) (Oral)   Wt 169 lb  3.2 oz (76.7 kg)   BMI 32.79 kg/m  General:   Alert and oriented. Well-nourished and well-developed. Changing position a lot as she is reporting diffuse joint pain today secondary to her arthritis.  Head:  Normocephalic and atraumatic. Eyes:  Without icterus, sclera clear and conjunctiva pink.  Ears:  Normal auditory acuity. Nose:  No deformity, discharge,  or lesions. Lungs:  Some crackles heard best in the lower  lungs bilaterally. Likely related to her ILD. No wheezes, or rhonchi. No distress.  Heart:  S1, S2 present without murmurs appreciated.  Abdomen:  +BS, soft, non-distended. Mild diffuse tenderness to deep palpation. States it feels a little sore. Not worse in any specific region. No HSM noted. No guarding or rebound. No masses appreciated.  Rectal:  Deferred  Msk:  Symmetrical without gross deformities. Normal posture. Extremities:  Without edema. Neurologic:  Alert and  oriented x4;  grossly normal neurologically. Skin:  Intact without significant lesions or rashes.  Psych: Normal mood and affect.

## 2018-09-26 ENCOUNTER — Encounter: Payer: Self-pay | Admitting: Gastroenterology

## 2018-09-26 ENCOUNTER — Ambulatory Visit (INDEPENDENT_AMBULATORY_CARE_PROVIDER_SITE_OTHER): Payer: Medicare HMO | Admitting: Gastroenterology

## 2018-09-26 ENCOUNTER — Other Ambulatory Visit: Payer: Self-pay

## 2018-09-26 ENCOUNTER — Telehealth: Payer: Self-pay | Admitting: Gastroenterology

## 2018-09-26 VITALS — BP 147/79 | HR 108 | Temp 97.1°F | Wt 169.2 lb

## 2018-09-26 DIAGNOSIS — K219 Gastro-esophageal reflux disease without esophagitis: Secondary | ICD-10-CM

## 2018-09-26 DIAGNOSIS — R1310 Dysphagia, unspecified: Secondary | ICD-10-CM | POA: Diagnosis not present

## 2018-09-26 DIAGNOSIS — K5900 Constipation, unspecified: Secondary | ICD-10-CM | POA: Diagnosis not present

## 2018-09-26 MED ORDER — OMEPRAZOLE 40 MG PO CPDR: 40.0000 mg | DELAYED_RELEASE_CAPSULE | Freq: Every day | ORAL | 4 refills | Status: DC

## 2018-09-26 MED ORDER — FAMOTIDINE 20 MG PO TABS: 20.0000 mg | ORAL_TABLET | Freq: Every day | ORAL | 4 refills | Status: DC

## 2018-09-26 NOTE — Assessment & Plan Note (Signed)
Typically BMs daily. Constipation occasionally with straining. Add benefiber or metamucil daily.

## 2018-09-26 NOTE — Patient Instructions (Addendum)
Please stop taking Protonix 40 mg and start taking Omeprazole 40 mg daily 30 minutes before breakfast.  Start taking Pepcid every night before bed.   Please see GERD handout below regarding dietary changes. Avoid spicy, fatty, and fried foods. Avoid carbonated beverages and caffeine.   Please add metamucil or benefiber daily to help with your occasional constipation.   We will see you back in 4 weeks. Please call if concerns prior.   Aliene Altes, PA-C [facility]   Food Choices for Gastroesophageal Reflux Disease, Adult When you have gastroesophageal reflux disease (GERD), the foods you eat and your eating habits are very important. Choosing the right foods can help ease your discomfort. Think about working with a nutrition specialist (dietitian) to help you make good choices. What are tips for following this plan?  Meals  Choose healthy foods that are low in fat, such as fruits, vegetables, whole grains, low-fat dairy products, and lean meat, fish, and poultry.  Eat small meals often instead of 3 large meals a day. Eat your meals slowly, and in a place where you are relaxed. Avoid bending over or lying down until 2-3 hours after eating.  Avoid eating meals 2-3 hours before bed.  Avoid drinking a lot of liquid with meals.  Cook foods using methods other than frying. Bake, grill, or broil food instead.  Avoid or limit: ? Chocolate. ? Peppermint or spearmint. ? Alcohol. ? Pepper. ? Black and decaffeinated coffee. ? Black and decaffeinated tea. ? Bubbly (carbonated) soft drinks. ? Caffeinated energy drinks and soft drinks.  Limit high-fat foods such as: ? Fatty meat or fried foods. ? Whole milk, cream, butter, or ice cream. ? Nuts and nut butters. ? Pastries, donuts, and sweets made with butter or shortening.  Avoid foods that cause symptoms. These foods may be different for everyone. Common foods that cause symptoms include: ? Tomatoes. ? Oranges,  lemons, and limes. ? Peppers. ? Spicy food. ? Onions and garlic. ? Vinegar. Lifestyle  Maintain a healthy weight. Ask your doctor what weight is healthy for you. If you need to lose weight, work with your doctor to do so safely.  Exercise for at least 30 minutes for 5 or more days each week, or as told by your doctor.  Wear loose-fitting clothes.  Do not smoke. If you need help quitting, ask your doctor.  Sleep with the head of your bed higher than your feet. Use a wedge under the mattress or blocks under the bed frame to raise the head of the bed. Summary  When you have gastroesophageal reflux disease (GERD), food and lifestyle choices are very important in easing your symptoms.  Eat small meals often instead of 3 large meals a day. Eat your meals slowly, and in a place where you are relaxed.  Limit high-fat foods such as fatty meat or fried foods.  Avoid bending over or lying down until 2-3 hours after eating.  Avoid peppermint and spearmint, caffeine, alcohol, and chocolate. This information is not intended to replace advice given to you by your health care provider. Make sure you discuss any questions you have with your health care provider. Document Released: 06/27/2011 Document Revised: 04/18/2018 Document Reviewed: 02/01/2016 Elsevier Patient Education  2020 [employer].

## 2018-09-26 NOTE — Telephone Encounter (Signed)
Can we request TCS and EGD records from [facility]?

## 2018-09-26 NOTE — Assessment & Plan Note (Addendum)
Chronic. Present for about 3-4 years. Symptoms not adequately controlled. Was on omeprazole 20 mg for about 6 months then recently switched to Protonix 40 mg about 2-3 months ago. Typically, her symptoms are well controlled during the day but she is having reflux/heartburn about every night before she lays down. Tums helps night time reflux. Felt omeprazole controlled her symptoms better than the Protonix. Associated with occasional nausea without vomiting. Also dysphagia 1-2 times a month with bread. Denies abdominal pain, although some mild tenderness to palpation diffusely on exam. Denies melena or brbpr. Aleve 1-2 times a month. Discussed possible EGD +/- dilation, but patient is not interested at this time.   Suspect dietary habits and body habitus are influencing reflux. Will start by stopping Protonix and starting Omeprazole 40 mg daily as omeprazole 20 mg seemed to help more in the past.  Start Pepcid 20 mg at night before bed.  Stop Aleve. Try Tylenol.  Follow GERD diet. Handout provided.  Follow-up in 4 weeks. If no significant improvement, would need EGD.

## 2018-09-26 NOTE — Assessment & Plan Note (Signed)
Addressed under GERD 

## 2018-09-29 NOTE — Telephone Encounter (Signed)
requested

## 2018-10-01 NOTE — Progress Notes (Signed)
cc'ed to pcp °

## 2018-10-21 DIAGNOSIS — Z6831 Body mass index (BMI) 31.0-31.9, adult: Secondary | ICD-10-CM | POA: Diagnosis not present

## 2018-10-21 DIAGNOSIS — M0579 Rheumatoid arthritis with rheumatoid factor of multiple sites without organ or systems involvement: Secondary | ICD-10-CM | POA: Diagnosis not present

## 2018-10-21 DIAGNOSIS — E669 Obesity, unspecified: Secondary | ICD-10-CM | POA: Diagnosis not present

## 2018-10-21 DIAGNOSIS — M2550 Pain in unspecified joint: Secondary | ICD-10-CM | POA: Diagnosis not present

## 2018-10-21 DIAGNOSIS — Z79899 Other long term (current) drug therapy: Secondary | ICD-10-CM | POA: Diagnosis not present

## 2018-10-22 ENCOUNTER — Telehealth: Payer: Self-pay | Admitting: General Practice

## 2018-10-22 ENCOUNTER — Telehealth: Payer: Self-pay

## 2018-10-22 NOTE — Telephone Encounter (Signed)
REVIEWED-NO ADDITIONAL RECOMMENDATIONS. 

## 2018-10-22 NOTE — Telephone Encounter (Signed)
Pt walked in office around 4 PM on 10/21/2018. Pt brought her appointment card with the date of 10/21/2018 and stated she wanted our office to know that she had an appointment with our office and was turned away. Pt showed up for her appointment the morning of 10/21/2018 and the appointment wasn't in the system. Offered to schedule pt with [facility], pt refused to schedule and said she doesn't want to return to the office due to not being seen the morning of 10/21/2018. I apologized to pt and tried to schedule an appointment. Pt declined appointment.

## 2018-10-22 NOTE — Telephone Encounter (Signed)
Patient came in stating she had an appt on 10/12, however we didn't have her on the schedule.  She did have a, appt card showing an appt for 10/12 at 10:30 am.  I tried to cal the patient several times, no answer and was unable to leave a message.

## 2018-10-22 NOTE — Telephone Encounter (Signed)
I tried to call the patient several times, no answer

## 2018-10-28 DIAGNOSIS — Z299 Encounter for prophylactic measures, unspecified: Secondary | ICD-10-CM | POA: Diagnosis not present

## 2018-10-28 DIAGNOSIS — N184 Chronic kidney disease, stage 4 (severe): Secondary | ICD-10-CM | POA: Diagnosis not present

## 2018-10-28 DIAGNOSIS — Z6832 Body mass index (BMI) 32.0-32.9, adult: Secondary | ICD-10-CM | POA: Diagnosis not present

## 2018-10-28 DIAGNOSIS — I10 Essential (primary) hypertension: Secondary | ICD-10-CM | POA: Diagnosis not present

## 2018-10-28 DIAGNOSIS — M069 Rheumatoid arthritis, unspecified: Secondary | ICD-10-CM | POA: Diagnosis not present

## 2018-10-29 DIAGNOSIS — I10 Essential (primary) hypertension: Secondary | ICD-10-CM | POA: Diagnosis not present

## 2018-10-29 DIAGNOSIS — N184 Chronic kidney disease, stage 4 (severe): Secondary | ICD-10-CM | POA: Diagnosis not present

## 2018-11-02 NOTE — Progress Notes (Signed)
Referring Provider: Monico Blitz, MD Primary Care Physician:  Monico Blitz, MD Primary GI Physician: Dr. Oneida Alar  Chief Complaint  Patient presents with  . Gastroesophageal Reflux    f/u. Doing okay    HPI:   Kimberly Livingston is a 68 y.o. female presenting today with a history of GERD.  Patient was last seen on 09/26/18 for GERD with symptoms worse in the evenings with associated occasional nausea, occasional dysphagia 1-2 times a month typically to breads or crackers, and occasional constipation with straining.  Suspected dietary habits and body habitus likely influencing GERD symptoms.  Change Protonix to omeprazole 40 mg daily and added Pepcid 20 mg at night.  Antireflux measures reinforced.  Patient not interested in EGD.  Add Benefiber or Metamucil.  Plan to follow-up in 4 weeks.  Last colonoscopy on 02/08/12: Exam limited due to poor prep.  Findings included moderate diverticulosis in the sigmoid colon.  Retroflexed views in the rectum revealed no abnormalities.  Recommendations to repeat colonoscopy in 2 years.  Today she states GERD is well controlled. No breakthrough symptoms. Taking Protonix, Prilosec, and pepcid. She didn't realize she was to stop protonix. Hasn't made significant dietary changes. States she does it more than she did. Denies and dysphagia since last visit. No nausea or vomiting. No abdominal pain.   BM daily. No constipation or diarrhea. No blood in the stool or black stools. Lost about 1 lb since her last visit. States she completed her entire prep for her last TCS. States she had regular food the day before.   Denies fever, chills, lightheadedness, or dizziness, or feeling like she will pass out. Denies chest pain, heart palpitations, SOB at rest. Some SOB with exertion. Occasional cough with allergies. No cold or flu like symptoms.    Past Medical History:  Diagnosis Date  . Allergic rhinitis   . Fatty liver   . GERD (gastroesophageal reflux disease)    . ILD (interstitial lung disease) ([place])   . Rheumatoid arthritis [facility])     Past Surgical History:  Procedure Laterality Date  . BREAST SURGERY    . COLONOSCOPY  2014  . FOOT SURGERY      Current Outpatient Medications  Medication Sig Dispense Refill  . albuterol (PROVENTIL HFA;VENTOLIN HFA) 108 (90 Base) MCG/ACT inhaler Inhale 2 puffs into the lungs every 6 (six) hours as needed for wheezing or shortness of breath. 1 Inhaler 2  . albuterol (PROVENTIL) (2.5 MG/3ML) 0.083% nebulizer solution Take 2.5 mg by nebulization every 6 (six) hours as needed for wheezing or shortness of breath.    . calcium carbonate (TUMS - DOSED IN MG ELEMENTAL CALCIUM) 500 MG chewable tablet Chew 1 tablet by mouth as needed.     Marland Kitchen dextromethorphan-guaiFENesin (MUCINEX DM) 30-600 MG 12hr tablet Take 1 tablet by mouth 2 (two) times daily as needed.     . famotidine (PEPCID) 20 MG tablet Take 1 tablet (20 mg total) by mouth at bedtime. 30 tablet 4  . folic acid (FOLVITE) 1 MG tablet Take 2 mg by mouth daily.    . methotrexate (RHEUMATREX) 2.5 MG tablet 9 tablets daily    . omeprazole (PRILOSEC) 40 MG capsule Take 1 capsule (40 mg total) by mouth daily. 30 capsule 4  . PARoxetine (PAXIL) 40 MG tablet Take 40 mg by mouth every morning.    Marland Kitchen PAZEO 0.7 % SOLN as needed.   6  . predniSONE (DELTASONE) 5 MG tablet Take 5-15 mg by mouth daily  with breakfast.    . traZODone (DESYREL) 50 MG tablet Take 50 mg by mouth daily.  2  . vitamin C (ASCORBIC ACID) 500 MG tablet Take 500 mg by mouth daily.    Marland Kitchen VITAMIN E PO Take 1,000 Units by mouth daily. When she remembers     No current facility-administered medications for this visit.     Allergies as of 11/04/2018 - Review Complete 11/04/2018  Allergen Reaction Noted  . Penicillins Rash 07/15/2015    Family History  Problem Relation Age of Onset  . Stroke Mother   . Heart disease Father   . Allergic rhinitis Sister   . Allergic rhinitis Brother   . Heart attack  Brother   . Colon cancer Neg Hx   . Colon polyps Neg Hx     Social History   Socioeconomic History  . Marital status: Married    Spouse name: Not on file  . Number of children: Not on file  . Years of education: Not on file  . Highest education level: Not on file  Occupational History  . Not on file  Social Needs  . Financial resource strain: Not on file  . Food insecurity    Worry: Not on file    Inability: Not on file  . Transportation needs    Medical: Not on file    Non-medical: Not on file  Tobacco Use  . Smoking status: Never Smoker  . Smokeless tobacco: Never Used  Substance and Sexual Activity  . Alcohol use: No  . Drug use: No  . Sexual activity: Not on file  Lifestyle  . Physical activity    Days per week: Not on file    Minutes per session: Not on file  . Stress: Not on file  Relationships  . Social Herbalist on phone: Not on file    Gets together: Not on file    Attends religious service: Not on file    Active member of club or organization: Not on file    Attends meetings of clubs or organizations: Not on file    Relationship status: Not on file  Other Topics Concern  . Not on file  Social History Narrative  . Not on file    Review of Systems: Gen: See HPI CV: See HPI Resp: See HPI GI: See HPI Derm: Denies rash Heme: Denies bruising, bleeding  Physical Exam: BP 131/76   Pulse 97   Temp (!) 97.1 F (36.2 C) (Oral)   Ht 5\' 2"  (1.575 m)   Wt 168 lb 9.6 oz (76.5 kg)   BMI 30.84 kg/m  General:   Alert and oriented. No distress noted. Pleasant and cooperative.  Head:  Normocephalic and atraumatic. Eyes:  Conjuctiva clear without scleral icterus. Heart:  S1, S2 present without murmurs appreciated. Lungs:  Minimal crackles hear throughout. No wheezes or rhonchi. No distress. Patient reports breathing is at baseline. States she just saw her PCP a couple week ago with no need to change her therapy.  Abdomen:  +BS, soft, non-tender  and non-distended. No rebound or guarding. No HSM or masses noted. Msk:  Symmetrical without gross deformities. Normal posture. Extremities:  Without edema. Neurologic:  Alert and  oriented x4 Psych: Alert and cooperative. Normal mood and affect.

## 2018-11-04 ENCOUNTER — Encounter: Payer: Self-pay | Admitting: *Deleted

## 2018-11-04 ENCOUNTER — Ambulatory Visit (INDEPENDENT_AMBULATORY_CARE_PROVIDER_SITE_OTHER): Payer: Medicare HMO | Admitting: Gastroenterology

## 2018-11-04 ENCOUNTER — Other Ambulatory Visit: Payer: Self-pay

## 2018-11-04 ENCOUNTER — Other Ambulatory Visit: Payer: Self-pay | Admitting: *Deleted

## 2018-11-04 ENCOUNTER — Encounter: Payer: Self-pay | Admitting: Gastroenterology

## 2018-11-04 VITALS — BP 131/76 | HR 97 | Temp 97.1°F | Ht 62.0 in | Wt 168.6 lb

## 2018-11-04 DIAGNOSIS — Z1211 Encounter for screening for malignant neoplasm of colon: Secondary | ICD-10-CM

## 2018-11-04 DIAGNOSIS — K219 Gastro-esophageal reflux disease without esophagitis: Secondary | ICD-10-CM

## 2018-11-04 MED ORDER — PEG 3350-KCL-NA BICARB-NACL 420 G PO SOLR: 4000.0000 mL | Freq: Once | ORAL | 0 refills | Status: AC

## 2018-11-04 NOTE — Assessment & Plan Note (Addendum)
68 year old female with past medical history significant for GERD, interstitial lung disease, and rheumatoid arthritis, who is overdue for her screening colonoscopy.  Last colonoscopy in January 2014 at [facility] with exam limited due to poor prep.  Findings included moderate diverticulosis in the sigmoid colon.  Retroflexed views in the rectum revealed no abnormalities.  Recommendations repeat colonoscopy in 2 years.  Patient reports she completed her entire prep; however, she did not follow a clear liquid diet the day before her procedure.  She is without any significant upper or lower GI symptoms at this time.  No BRBPR or melena.  No other alarm symptoms.  No family history of colon cancer or colon polyps to patient's knowledge.  Proceed with TCS with propofol with Dr. Oneida Alar in the near future. The risks, benefits, and alternatives have been discussed in detail with patient. They have stated understanding and desire to proceed.  Follow-up after colonoscopy.

## 2018-11-04 NOTE — Assessment & Plan Note (Signed)
GERD symptoms are well controlled.  Patient currently taking omeprazole 40 mg daily, Protonix 40 mg daily, and Pepcid 20 mg nightly.  She did not stop taking Protonix after last visit when she was started on omeprazole.  She has made minimal dietary.  Denies dysphagia.  No alarm symptoms.  Advised she stop Protonix and continue omeprazole 40 mg daily and Pepcid 20 mg nightly. Reinforced GERD diet and lifestyle.  GERD handout provided. Continue to monitor symptoms.   Follow-up after colonoscopy.  Call if questions or concerns prior.

## 2018-11-04 NOTE — Patient Instructions (Addendum)
Please stop taking Protonix 40 mg.  Continue taking omeprazole 40 mg 30 minutes before breakfast and Pepcid 20 mg at night.  Please continue weight loss efforts and follow a GERD diet.  You should avoid fatty, fried, greasy, spicy, and citrus foods.  Avoid caffeine, carbonated beverages, and chocolate.  Meats should be baked, broiled, or grilled.  Do not eat within 3 hours of laying down.  See GERD handout below.  We will get you scheduled for colonoscopy with Dr. Oneida Alar in the near future.  We will plan to see you back after your colonoscopy.  Call if you have questions or concerns prior to your next visit.  Aliene Altes, PA-C [facility]    Food Choices for Gastroesophageal Reflux Disease, Adult When you have gastroesophageal reflux disease (GERD), the foods you eat and your eating habits are very important. Choosing the right foods can help ease your discomfort. Think about working with a nutrition specialist (dietitian) to help you make good choices. What are tips for following this plan?  Meals  Choose healthy foods that are low in fat, such as fruits, vegetables, whole grains, low-fat dairy products, and lean meat, fish, and poultry.  Eat small meals often instead of 3 large meals a day. Eat your meals slowly, and in a place where you are relaxed. Avoid bending over or lying down until 2-3 hours after eating.  Avoid eating meals 2-3 hours before bed.  Avoid drinking a lot of liquid with meals.  Cook foods using methods other than frying. Bake, grill, or broil food instead.  Avoid or limit: ? Chocolate. ? Peppermint or spearmint. ? Alcohol. ? Pepper. ? Black and decaffeinated coffee. ? Black and decaffeinated tea. ? Bubbly (carbonated) soft drinks. ? Caffeinated energy drinks and soft drinks.  Limit high-fat foods such as: ? Fatty meat or fried foods. ? Whole milk, cream, butter, or ice cream. ? Nuts and nut butters. ? Pastries, donuts, and sweets  made with butter or shortening.  Avoid foods that cause symptoms. These foods may be different for everyone. Common foods that cause symptoms include: ? Tomatoes. ? Oranges, lemons, and limes. ? Peppers. ? Spicy food. ? Onions and garlic. ? Vinegar. Lifestyle  Maintain a healthy weight. Ask your doctor what weight is healthy for you. If you need to lose weight, work with your doctor to do so safely.  Exercise for at least 30 minutes for 5 or more days each week, or as told by your doctor.  Wear loose-fitting clothes.  Do not smoke. If you need help quitting, ask your doctor.  Sleep with the head of your bed higher than your feet. Use a wedge under the mattress or blocks under the bed frame to raise the head of the bed. Summary  When you have gastroesophageal reflux disease (GERD), food and lifestyle choices are very important in easing your symptoms.  Eat small meals often instead of 3 large meals a day. Eat your meals slowly, and in a place where you are relaxed.  Limit high-fat foods such as fatty meat or fried foods.  Avoid bending over or lying down until 2-3 hours after eating.  Avoid peppermint and spearmint, caffeine, alcohol, and chocolate. This information is not intended to replace advice given to you by your health care provider. Make sure you discuss any questions you have with your health care provider. Document Released: 06/27/2011 Document Revised: 04/18/2018 Document Reviewed: 02/01/2016 Elsevier Patient Education  2020 [employer].

## 2018-11-19 DIAGNOSIS — R7989 Other specified abnormal findings of blood chemistry: Secondary | ICD-10-CM | POA: Diagnosis not present

## 2018-12-25 ENCOUNTER — Other Ambulatory Visit: Payer: Self-pay | Admitting: Gastroenterology

## 2018-12-25 DIAGNOSIS — K219 Gastro-esophageal reflux disease without esophagitis: Secondary | ICD-10-CM

## 2019-01-27 ENCOUNTER — Telehealth: Payer: Self-pay

## 2019-01-27 NOTE — Telephone Encounter (Signed)
TCS scheduled for 02/11/19 has to be cancelled d/t surge in [place]. Unable to do screening procedures at this time.   Tried to call home number, no answer, LMOAM for return call.

## 2019-01-27 NOTE — Telephone Encounter (Signed)
Pt called back.  Informed her that her procedure and Covid screening would need to be cancelled d/t surge in Covid.  Pt informed that we will reach out to her once we have clearance to reschedule pts.  Pt voiced understanding.

## 2019-01-27 NOTE — Telephone Encounter (Signed)
Noted  

## 2019-01-27 NOTE — Telephone Encounter (Signed)
Endo scheduler informed to cancel procedure. ?

## 2019-02-06 ENCOUNTER — Telehealth: Payer: Self-pay

## 2019-02-06 NOTE — Telephone Encounter (Signed)
Called pt, TCS w/Prop w/SLF rescheduled to 02/11/19 at 2:00pm. Orders entered.   Pre-op and COVID test 02/10/19. Called pt back and informed her of appts 02/10/19. She will come by office 02/10/19 to p/u new procedure instructions. She is aware to start clear liquids all day 02/10/19.

## 2019-02-07 ENCOUNTER — Encounter (HOSPITAL_COMMUNITY): Payer: Medicare HMO

## 2019-02-07 ENCOUNTER — Encounter (HOSPITAL_COMMUNITY): Payer: Self-pay

## 2019-02-07 ENCOUNTER — Other Ambulatory Visit: Payer: Self-pay

## 2019-02-07 ENCOUNTER — Other Ambulatory Visit (HOSPITAL_COMMUNITY): Payer: Medicare HMO

## 2019-02-10 ENCOUNTER — Other Ambulatory Visit (HOSPITAL_COMMUNITY)
Admission: RE | Admit: 2019-02-10 | Discharge: 2019-02-10 | Disposition: A | Discharge status: Home / Self Care | Payer: Medicare HMO | Source: Ambulatory Visit | Attending: Gastroenterology | Admitting: Gastroenterology

## 2019-02-10 ENCOUNTER — Other Ambulatory Visit: Payer: Self-pay

## 2019-02-10 ENCOUNTER — Telehealth: Payer: Self-pay | Admitting: Gastroenterology

## 2019-02-10 ENCOUNTER — Encounter (HOSPITAL_COMMUNITY)
Admission: RE | Admit: 2019-02-10 | Discharge: 2019-02-10 | Disposition: A | Discharge status: Home / Self Care | Payer: Medicare HMO | Source: Ambulatory Visit | Attending: Gastroenterology | Admitting: Gastroenterology

## 2019-02-10 DIAGNOSIS — Z20822 Contact with and (suspected) exposure to covid-19: Secondary | ICD-10-CM | POA: Insufficient documentation

## 2019-02-10 DIAGNOSIS — Z01812 Encounter for preprocedural laboratory examination: Secondary | ICD-10-CM | POA: Insufficient documentation

## 2019-02-10 LAB — SARS CORONAVIRUS 2 (TAT 6-24 HRS): SARS Coronavirus 2: NEGATIVE

## 2019-02-10 NOTE — Telephone Encounter (Signed)
Pt is scheduled procedure tomorrow and [facility] does not have the prep prescription. Please fax instructions as well.

## 2019-02-10 NOTE — Telephone Encounter (Signed)
See other phone note

## 2019-02-10 NOTE — Patient Instructions (Addendum)
Kimberly Livingston  02/10/2019      Your procedure is scheduled on 02/11/19 @ 12:30 PM   Call this number if you have problems the morning of surgery:  [phone]   Remember:  Do not eat or drink after midnight.    Follow Dr Oneida Alar prep instruction given to you at the office.     Take these medicines the morning of surgery with A SIP OF WATER ; Pepcid, Protonix, Prednisone, Trazadone and   please use your inhaler.     [facility] is not responsible for any belongings or valuables.  Contacts, dentures or bridgework may not be worn into surgery.  Leave your suitcase in the car.  After surgery it may be brought to your room.  For patients admitted to the hospital, discharge time will be determined by your treatment team.  Patients discharged the day of surgery will not be allowed to drive home.   Name and phone number of your driver:  Kimberly Livingston husband  Special instructions:  n/a  Please read over the following fact sheets that you were given. Care and Recovery After Surgery  Colonoscopy, Adult A colonoscopy is a procedure to look at the entire large intestine. This procedure is done using a long, thin, flexible tube that has a camera on the end. You may have a colonoscopy:  As a part of normal colorectal screening.  If you have certain symptoms, such as: ? A low number of red blood cells in your blood (anemia). ? Diarrhea that does not go away. ? Pain in your abdomen. ? Blood in your stool. A colonoscopy can help screen for and diagnose medical problems, including:  Tumors.  Extra tissue that grows where mucus forms (polyps).  Inflammation.  Areas of bleeding. Tell your health care provider about:  Any allergies you have.  All medicines you are taking, including vitamins, herbs, eye drops, creams, and over-the-counter medicines.  Any problems you or family members have had with anesthetic medicines.  Any blood disorders you have.  Any surgeries you  have had.  Any medical conditions you have.  Any problems you have had with having bowel movements.  Whether you are pregnant or may be pregnant. What are the risks? Generally, this is a safe procedure. However, problems may occur, including:  Bleeding.  Damage to your intestine.  Allergic reactions to medicines given during the procedure.  Infection. This is rare. What happens before the procedure? Eating and drinking restrictions Follow instructions from your health care provider about eating or drinking restrictions, which may include:  A few days before the procedure: ? Follow a low-fiber diet. ? Avoid nuts, seeds, dried fruit, raw fruits, and vegetables.  1-3 days before the procedure: ? Eat only gelatin dessert or ice pops. ? Drink only clear liquids, such as water, clear juice, clear broth or bouillon, black coffee or tea, or clear soft drinks or sports drinks. ? Avoid liquids that contain red or purple dye.  The day of the procedure: ? Do not eat solid foods. You may continue to drink clear liquids until up to 2 hours before the procedure. ? Do not eat or drink anything starting 2 hours before the procedure, or within the time period that your health care provider recommends. Bowel prep If you were prescribed a bowel prep to take by mouth (orally) to clean out your colon:  Take it as told by your health care provider. Starting the day before your procedure, you will  need to drink a large amount of liquid medicine. The liquid will cause you to have many bowel movements of loose stool until your stool becomes almost clear or light green.  If your skin or the opening between the buttocks (anus) gets irritated from diarrhea, you may relieve the irritation using: ? Wipes with medicine in them, such as adult wet wipes with aloe and vitamin E. ? A product to soothe skin, such as petroleum jelly.  If you vomit while drinking the bowel prep: ? Take a break for up to 60  minutes. ? Begin the bowel prep again. ? Call your health care provider if you keep vomiting or you cannot take the bowel prep without vomiting.  To clean out your colon, you may also be given: ? Laxative medicines. These help you have a bowel movement. ? Instructions for enema use. An enema is liquid medicine injected into your rectum. Medicines Ask your health care provider about:  Changing or stopping your regular medicines or supplements. This is especially important if you are taking iron supplements, diabetes medicines, or blood thinners.  Taking medicines such as aspirin and ibuprofen. These medicines can thin your blood. Do not take these medicines unless your health care provider tells you to take them.  Taking over-the-counter medicines, vitamins, herbs, and supplements. General instructions  Ask your health care provider what steps will be taken to help prevent infection. These may include washing skin with a germ-killing soap.  Plan to have someone take you home from the hospital or clinic. What happens during the procedure?   An IV will be inserted into one of your veins.  You may be given one or more of the following: ? A medicine to help you relax (sedative). ? A medicine to numb the area (local anesthetic). ? A medicine to make you fall asleep (general anesthetic). This is rarely needed.  You will lie on your side with your knees bent.  The tube will: ? Have oil or gel put on it (be lubricated). ? Be inserted into your anus. ? Be gently eased through all parts of your large intestine.  Air will be sent into your colon to keep it open. This may cause some pressure or cramping.  Images will be taken with the camera and will appear on a screen.  A small tissue sample may be removed to be looked at under a microscope (biopsy). The tissue may be sent to a lab for testing if any signs of problems are found.  If small polyps are found, they may be removed and  checked for cancer cells.  When the procedure is finished, the tube will be removed. The procedure may vary among health care providers and hospitals. What happens after the procedure?  Your blood pressure, heart rate, breathing rate, and blood oxygen level will be monitored until you leave the hospital or clinic.  You may have a small amount of blood in your stool.  You may pass gas and have mild cramping or bloating in your abdomen. This is caused by the air that was used to open your colon during the exam.  Do not drive for 24 hours after the procedure.  It is up to you to get the results of your procedure. Ask your health care provider, or the department that is doing the procedure, when your results will be ready. Summary  A colonoscopy is a procedure to look at the entire large intestine.  Follow instructions from your health care  provider about eating and drinking before the procedure.  If you were prescribed an oral bowel prep to clean out your colon, take it as told by your health care provider.  During the colonoscopy, a flexible tube with a camera on its end is inserted into the anus and then passed into the other parts of the large intestine. This information is not intended to replace advice given to you by your health care provider. Make sure you discuss any questions you have with your health care provider. Document Revised: 07/19/2018 Document Reviewed: 07/19/2018 Elsevier Patient Education  [employer].

## 2019-02-10 NOTE — Telephone Encounter (Signed)
[facility] and spoke to Butch Penny, verbal given to substitute [place] for [employer].

## 2019-02-10 NOTE — Telephone Encounter (Signed)
Pt is scheduled for tomorrow and the prep is on back order. [facility] [place]

## 2019-02-11 ENCOUNTER — Encounter (HOSPITAL_COMMUNITY): Payer: Self-pay | Admitting: Gastroenterology

## 2019-02-11 ENCOUNTER — Ambulatory Visit (HOSPITAL_COMMUNITY)
Admission: RE | Admit: 2019-02-11 | Discharge: 2019-02-11 | Disposition: A | Discharge status: Home / Self Care | Payer: Medicare HMO | Attending: Gastroenterology | Admitting: Gastroenterology

## 2019-02-11 ENCOUNTER — Ambulatory Visit (HOSPITAL_COMMUNITY): Admit: 2019-02-11 | Payer: Medicare HMO | Admitting: Gastroenterology

## 2019-02-11 ENCOUNTER — Ambulatory Visit (HOSPITAL_COMMUNITY): Payer: Medicare HMO | Admitting: Anesthesiology

## 2019-02-11 ENCOUNTER — Encounter (HOSPITAL_COMMUNITY): Payer: Self-pay

## 2019-02-11 ENCOUNTER — Other Ambulatory Visit: Payer: Self-pay

## 2019-02-11 ENCOUNTER — Encounter (HOSPITAL_COMMUNITY)
Admission: RE | Disposition: A | Discharge status: Home / Self Care | Payer: Self-pay | Source: Home / Self Care | Attending: Gastroenterology

## 2019-02-11 DIAGNOSIS — K644 Residual hemorrhoidal skin tags: Secondary | ICD-10-CM | POA: Diagnosis not present

## 2019-02-11 DIAGNOSIS — J45909 Unspecified asthma, uncomplicated: Secondary | ICD-10-CM | POA: Insufficient documentation

## 2019-02-11 DIAGNOSIS — Z880 Allergy status to penicillin: Secondary | ICD-10-CM | POA: Insufficient documentation

## 2019-02-11 DIAGNOSIS — D128 Benign neoplasm of rectum: Secondary | ICD-10-CM | POA: Diagnosis not present

## 2019-02-11 DIAGNOSIS — Z7952 Long term (current) use of systemic steroids: Secondary | ICD-10-CM | POA: Insufficient documentation

## 2019-02-11 DIAGNOSIS — D122 Benign neoplasm of ascending colon: Secondary | ICD-10-CM | POA: Insufficient documentation

## 2019-02-11 DIAGNOSIS — Z8489 Family history of other specified conditions: Secondary | ICD-10-CM | POA: Insufficient documentation

## 2019-02-11 DIAGNOSIS — M069 Rheumatoid arthritis, unspecified: Secondary | ICD-10-CM | POA: Insufficient documentation

## 2019-02-11 DIAGNOSIS — Z1211 Encounter for screening for malignant neoplasm of colon: Secondary | ICD-10-CM | POA: Insufficient documentation

## 2019-02-11 DIAGNOSIS — K760 Fatty (change of) liver, not elsewhere classified: Secondary | ICD-10-CM | POA: Insufficient documentation

## 2019-02-11 DIAGNOSIS — K621 Rectal polyp: Secondary | ICD-10-CM | POA: Insufficient documentation

## 2019-02-11 DIAGNOSIS — K219 Gastro-esophageal reflux disease without esophagitis: Secondary | ICD-10-CM | POA: Diagnosis not present

## 2019-02-11 DIAGNOSIS — Z823 Family history of stroke: Secondary | ICD-10-CM | POA: Diagnosis not present

## 2019-02-11 DIAGNOSIS — J849 Interstitial pulmonary disease, unspecified: Secondary | ICD-10-CM | POA: Insufficient documentation

## 2019-02-11 DIAGNOSIS — K648 Other hemorrhoids: Secondary | ICD-10-CM | POA: Insufficient documentation

## 2019-02-11 DIAGNOSIS — K635 Polyp of colon: Secondary | ICD-10-CM

## 2019-02-11 DIAGNOSIS — K5730 Diverticulosis of large intestine without perforation or abscess without bleeding: Secondary | ICD-10-CM | POA: Insufficient documentation

## 2019-02-11 DIAGNOSIS — Z8249 Family history of ischemic heart disease and other diseases of the circulatory system: Secondary | ICD-10-CM | POA: Insufficient documentation

## 2019-02-11 DIAGNOSIS — Z79899 Other long term (current) drug therapy: Secondary | ICD-10-CM | POA: Diagnosis not present

## 2019-02-11 HISTORY — PX: COLONOSCOPY WITH PROPOFOL: SHX5780

## 2019-02-11 HISTORY — PX: POLYPECTOMY: SHX5525

## 2019-02-11 SURGERY — COLONOSCOPY WITH PROPOFOL
Anesthesia: General

## 2019-02-11 SURGERY — COLONOSCOPY WITH PROPOFOL
Anesthesia: Monitor Anesthesia Care

## 2019-02-11 MED ORDER — MIDAZOLAM HCL 2 MG/2ML IJ SOLN: 0.5000 mg | Freq: Once | INTRAMUSCULAR | Status: DC | PRN

## 2019-02-11 MED ORDER — HYDROMORPHONE HCL 1 MG/ML IJ SOLN: 0.2500 mg | INTRAMUSCULAR | Status: DC | PRN

## 2019-02-11 MED ORDER — LACTATED RINGERS IV SOLN: INTRAVENOUS | Status: DC

## 2019-02-11 MED ORDER — HYDROCODONE-ACETAMINOPHEN 7.5-325 MG PO TABS: 1.0000 | ORAL_TABLET | Freq: Once | ORAL | Status: DC | PRN

## 2019-02-11 MED ORDER — CHLORHEXIDINE GLUCONATE CLOTH 2 % EX PADS: 6.0000 | MEDICATED_PAD | Freq: Once | CUTANEOUS | Status: DC

## 2019-02-11 MED ORDER — PROPOFOL 500 MG/50ML IV EMUL
INTRAVENOUS | Status: DC | PRN
  Administered 2019-02-11: 150 ug/kg/min via INTRAVENOUS

## 2019-02-11 MED ORDER — PROPOFOL 10 MG/ML IV BOLUS
INTRAVENOUS | Status: DC | PRN
  Administered 2019-02-11: 100 mg via INTRAVENOUS

## 2019-02-11 MED ORDER — PROPOFOL 10 MG/ML IV BOLUS
INTRAVENOUS | Status: AC
  Filled 2019-02-11: qty 20

## 2019-02-11 MED ORDER — LIDOCAINE HCL (CARDIAC) PF 100 MG/5ML IV SOSY
PREFILLED_SYRINGE | INTRAVENOUS | Status: DC | PRN
  Administered 2019-02-11: 50 mg via INTRAVENOUS

## 2019-02-11 MED ORDER — PROMETHAZINE HCL 25 MG/ML IJ SOLN: 6.2500 mg | INTRAMUSCULAR | Status: DC | PRN

## 2019-02-11 NOTE — Discharge Instructions (Signed)
You have small internal hemorrhoids and diverticulosis IN YOUR LEFT COLON. YOU HAD TWO SMALL POLYPS REMOVED.    DRINK WATER TO KEEP YOUR URINE LIGHT YELLOW.  CONTINUE YOUR WEIGHT LOSS EFFORTS. YOUR BODY MASS INDEX IS OVER 30 WHICH MEANS YOU ARE OBESE. OBESITY IS ASSOCIATED WITH AN INCREASE FOR CIRRHOSIS AND ALL CANCERS, INCLUDING ESOPHAGEAL AND COLON CANCER. I RECOMMEND YOU READ AND FOLLOW RECOMMENDATIONS BY DR. MARK HYMAN, "10-DAY DETOX DIET".  FOLLOW A HIGH FIBER DIET. AVOID ITEMS THAT CAUSE BLOATING. See info below.   USE PREPARATION H FOUR TIMES  A DAY IF NEEDED TO RELIEVE RECTAL PAIN/PRESSURE/BLEEDING.   YOUR BIOPSY RESULTS WILL BE BACK IN 5 BUSINESS DAYS.  Next colonoscopy in 5-10 years.  Colonoscopy Care After Read the instructions outlined below and refer to this sheet in the next week. These discharge instructions provide you with general information on caring for yourself after you leave the hospital. While your treatment has been planned according to the most current medical practices available, unavoidable complications occasionally occur. If you have any problems or questions after discharge, call DR. Darryle Dennie, [phone].  ACTIVITY  You may resume your regular activity, but move at a slower pace for the next 24 hours.   Take frequent rest periods for the next 24 hours.   Walking will help get rid of the air and reduce the bloated feeling in your belly (abdomen).   No driving for 24 hours (because of the medicine (anesthesia) used during the test).   You may shower.   Do not sign any important legal documents or operate any machinery for 24 hours (because of the anesthesia used during the test).    NUTRITION  Drink plenty of fluids.   You may resume your normal diet as instructed by your doctor.   Begin with a light meal and progress to your normal diet. Heavy or fried foods are harder to digest and may make you feel sick to your stomach (nauseated).   Avoid  alcoholic beverages for 24 hours or as instructed.    MEDICATIONS  You may resume your normal medications.   WHAT YOU CAN EXPECT TODAY  Some feelings of bloating in the abdomen.   Passage of more gas than usual.   Spotting of blood in your stool or on the toilet paper  .  IF YOU HAD POLYPS REMOVED DURING THE COLONOSCOPY:  Eat a soft diet IF YOU HAVE NAUSEA, BLOATING, ABDOMINAL PAIN, OR VOMITING.    FINDING OUT THE RESULTS OF YOUR TEST Not all test results are available during your visit. DR. Oneida Alar WILL CALL YOU WITHIN 14 DAYS OF YOUR PROCEDUE WITH YOUR RESULTS. Do not assume everything is normal if you have not heard from DR. Izzy Doubek, CALL HER OFFICE AT [phone].  SEEK IMMEDIATE MEDICAL ATTENTION AND CALL THE OFFICE: [phone] IF:  You have more than a spotting of blood in your stool.   Your belly is swollen (abdominal distention).   You are nauseated or vomiting.   You have a temperature over 101F.   You have abdominal pain or discomfort that is severe or gets worse throughout the day.  High-Fiber Diet A high-fiber diet changes your normal diet to include more whole grains, legumes, fruits, and vegetables. Changes in the diet involve replacing refined carbohydrates with unrefined foods. The calorie level of the diet is essentially unchanged. The Dietary Reference Intake (recommended amount) for adult males is 38 grams per day. For adult females, it is 25 grams per day. Pregnant  and lactating women should consume 28 grams of fiber per day. Fiber is the intact part of a plant that is not broken down during digestion. Functional fiber is fiber that has been isolated from the plant to provide a beneficial effect in the body.  PURPOSE Increase stool bulk.  Ease and regulate bowel movements.  Lower cholesterol.  REDUCE RISK OF COLON CANCER  INDICATIONS THAT YOU NEED MORE FIBER Constipation and hemorrhoids.  Uncomplicated diverticulosis (intestine condition) and  irritable bowel syndrome.  Weight management.  As a protective measure against hardening of the arteries (atherosclerosis), diabetes, and cancer.   GUIDELINES FOR INCREASING FIBER IN THE DIET Start adding fiber to the diet slowly. A gradual increase of about 5 more grams (2 servings of most fruits or vegetables) per day is best. Too rapid an increase in fiber may result in constipation, flatulence, and bloating.  Drink enough water and fluids to keep your urine clear or pale yellow. Water, juice, or caffeine-free drinks are recommended. Not drinking enough fluid may cause constipation.  Eat a variety of high-fiber foods rather than one type of fiber.  Try to increase your intake of fiber through using high-fiber foods rather than fiber pills or supplements that contain small amounts of fiber.  The goal is to change the types of food eaten. Do not supplement your present diet with high-fiber foods, but replace foods in your present diet.    Polyps, Colon  A polyp is extra tissue that grows inside your body. Colon polyps grow in the large intestine. The large intestine, also called the colon, is part of your digestive system. It is a long, hollow tube at the end of your digestive tract where your body makes and stores stool. Most polyps are not dangerous. They are benign. This means they are not cancerous. But over time, some types of polyps can turn into cancer. Polyps that are smaller than a pea are usually not harmful. But larger polyps could someday become or may already be cancerous. To be safe, doctors remove all polyps and test them.   PREVENTION There is not one sure way to prevent polyps. You might be able to lower your risk of getting them if you:  Eat more fruits and vegetables and less fatty food.   Do not smoke.   Avoid alcohol.   Exercise every day.   Lose weight if you are overweight.   Eating more calcium and folate can also lower your risk of getting polyps. Some foods that  are rich in calcium are milk, cheese, and broccoli. Some foods that are rich in folate are chickpeas, kidney beans, and spinach.    Diverticulosis Diverticulosis is a common condition that develops when small pouches (diverticula) form in the wall of the colon. The risk of diverticulosis increases with age. It happens more often in people who eat a low-fiber diet. Most individuals with diverticulosis have no symptoms. Those individuals with symptoms usually experience belly (abdominal) pain, constipation, or loose stools (diarrhea).  HOME CARE INSTRUCTIONS  Increase the amount of fiber in your diet as directed by your caregiver or dietician. This may reduce symptoms of diverticulosis.   Drink at least 6 to 8 glasses of water each day to prevent constipation.   Try not to strain when you have a bowel movement.   Avoiding nuts and seeds to prevent complications is NOT NECESSARY.   FOODS HAVING HIGH FIBER CONTENT INCLUDE:  Fruits. Apple, peach, pear, tangerine, raisins, prunes.   Vegetables. Brussels  sprouts, asparagus, broccoli, cabbage, carrot, cauliflower, romaine lettuce, spinach, summer squash, tomato, winter squash, zucchini.   Starchy Vegetables. Baked beans, kidney beans, lima beans, split peas, lentils, potatoes (with skin).   Grains. Whole wheat bread, brown rice, bran flake cereal, plain oatmeal, white rice, shredded wheat, bran muffins.   SEEK IMMEDIATE MEDICAL CARE IF:  You develop increasing pain or severe bloating.   You have an oral temperature above 101F.   You develop vomiting or bowel movements that are bloody or black.

## 2019-02-11 NOTE — H&P (Signed)
Primary Care Physician:  Monico Blitz, MD Primary Gastroenterologist:  Dr. Oneida Alar  Pre-Procedure History & Physical: HPI:  Kimberly Livingston is a 69 y.o. female here for [place].  Past Medical History:  Diagnosis Date  . Allergic rhinitis   . Fatty liver   . GERD (gastroesophageal reflux disease)   . ILD (interstitial lung disease) ([place])   . Rheumatoid arthritis [facility])     Past Surgical History:  Procedure Laterality Date  . BREAST SURGERY Bilateral    implants  . COLONOSCOPY  2014  . FOOT SURGERY Bilateral    bunions    Prior to Admission medications   Medication Sig Start Date End Date Taking? Authorizing Provider  albuterol (PROVENTIL HFA;VENTOLIN HFA) 108 (90 Base) MCG/ACT inhaler Inhale 2 puffs into the lungs every 6 (six) hours as needed for wheezing or shortness of breath. 07/06/17  Yes Mannam, Praveen, MD  albuterol (PROVENTIL) (2.5 MG/3ML) 0.083% nebulizer solution Take 2.5 mg by nebulization every 6 (six) hours as needed for wheezing or shortness of breath.   Yes [provider]  calcium carbonate (TUMS - DOSED IN MG ELEMENTAL CALCIUM) 500 MG chewable tablet Chew 1 tablet by mouth 3 (three) times daily as needed for indigestion or heartburn.    Yes [provider]  dextromethorphan-guaiFENesin (MUCINEX DM) 30-600 MG 12hr tablet Take 1 tablet by mouth 2 (two) times daily as needed (cough/congestion.).    Yes [provider]  famotidine (PEPCID) 20 MG tablet TAKE 1 TABLET BY MOUTH EVERYDAY AT BEDTIME Patient taking differently: Take 20 mg by mouth daily as needed (indigestion/heartburn.).  12/26/18  Yes Annitta Needs, NP  folic acid (FOLVITE) 1 MG tablet Take 1 mg by mouth 2 (two) times daily.    Yes [provider]  methotrexate (RHEUMATREX) 2.5 MG tablet Take 20 mg by mouth every Wednesday. 8 tablets   Yes [provider]  omeprazole (PRILOSEC) 40 MG capsule TAKE 1 CAPSULE BY MOUTH EVERY DAY Patient taking  differently: Take 40 mg by mouth daily before breakfast.  12/26/18  Yes Annitta Needs, NP  PARoxetine (PAXIL) 40 MG tablet Take 40 mg by mouth daily.    Yes [provider]  PAZEO 0.7 % SOLN Place 1-2 drops into both eyes 3 (three) times daily as needed (allergies.).  06/26/15  Yes [provider]  predniSONE (DELTASONE) 5 MG tablet Take 5 mg by mouth daily with breakfast.    Yes [provider]  traZODone (DESYREL) 50 MG tablet Take 50 mg by mouth daily. 07/01/15  Yes [provider]  pantoprazole (PROTONIX) 40 MG tablet Take 40 mg by mouth daily as needed for heartburn. 12/24/18   [provider]    Allergies as of 02/06/2019 - Review Complete 02/06/2019  Allergen Reaction Noted  . Penicillins Swelling 07/15/2015    Family History  Problem Relation Age of Onset  . Stroke Mother   . Heart disease Father   . Allergic rhinitis Sister   . Allergic rhinitis Brother   . Heart attack Brother   . Colon cancer Neg Hx   . Colon polyps Neg Hx     Social History   Socioeconomic History  . Marital status: Married    Spouse name: Not on file  . Number of children: Not on file  . Years of education: Not on file  . Highest education level: Not on file  Occupational History  . Not on file  Tobacco Use  . Smoking status:  Never Smoker  . Smokeless tobacco: Never Used  Substance and Sexual Activity  . Alcohol use: No  . Drug use: No  . Sexual activity: Yes  Other Topics Concern  . Not on file  Social History Narrative  . Not on file   Social Determinants of Health   Financial Resource Strain:   . Difficulty of Paying Living Expenses: Not on file  Food Insecurity:   . Worried About Charity fundraiser in the Last Year: Not on file  . Ran Out of Food in the Last Year: Not on file  Transportation Needs:   . Lack of Transportation (Medical): Not on file  . Lack of Transportation (Non-Medical): Not on file  Physical Activity:   . Days of  Exercise per Week: Not on file  . Minutes of Exercise per Session: Not on file  Stress:   . Feeling of Stress : Not on file  Social Connections:   . Frequency of Communication with Friends and Family: Not on file  . Frequency of Social Gatherings with Friends and Family: Not on file  . Attends Religious Services: Not on file  . Active Member of Clubs or Organizations: Not on file  . Attends Archivist Meetings: Not on file  . Marital Status: Not on file  Intimate Partner Violence:   . Fear of Current or Ex-Partner: Not on file  . Emotionally Abused: Not on file  . Physically Abused: Not on file  . Sexually Abused: Not on file    Review of Systems: See HPI, otherwise negative ROS   Physical Exam: BP (!) 156/79   Pulse 87   Temp 98.6 F (37 C) (Oral)   Resp 13   Ht 5\' 1"  (1.549 m)   Wt 74.8 kg   SpO2 94%   BMI 31.18 kg/m  General:   Alert,  pleasant and cooperative in NAD Head:  Normocephalic and atraumatic. Neck:  Supple; Lungs:  Clear throughout to auscultation.    Heart:  Regular rate and rhythm. Abdomen:  Soft, nontender and nondistended. Normal bowel sounds, without guarding, and without rebound.   Neurologic:  Alert and  oriented x4;  grossly normal neurologically.  Impression/Plan:    SCREENING  Plan:  1. TCS TODAY DISCUSSED PROCEDURE, BENEFITS, & RISKS: < 1% chance of medication reaction, bleeding, perforation, ASPIRATION, or rupture of spleen/liver requiring surgery to fix it and missed polyps < 1 cm 10-20% of the time.

## 2019-02-11 NOTE — Anesthesia Postprocedure Evaluation (Signed)
Anesthesia Post Note  Patient: Kimberly Livingston  Procedure(s) Performed: COLONOSCOPY WITH PROPOFOL (N/A ) POLYPECTOMY  Patient location during evaluation: PACU Anesthesia Type: MAC Level of consciousness: awake, oriented, awake and alert and patient cooperative Pain management: pain level controlled Vital Signs Assessment: post-procedure vital signs reviewed and stable Respiratory status: spontaneous breathing, respiratory function stable and nonlabored ventilation Cardiovascular status: stable Postop Assessment: no apparent nausea or vomiting Anesthetic complications: no     Last Vitals:  Vitals:   02/11/19 1254  BP: (!) 156/79  Pulse: 87  Resp: 13  Temp: 37 C  SpO2: 94%    Last Pain:  Vitals:   02/11/19 1436  TempSrc:   PainSc: 0-No pain                 Zykia Walla

## 2019-02-11 NOTE — Op Note (Signed)
[facility] Patient Name: Kimberly Livingston Procedure Date: 02/11/2019 1:59 PM MRN: [id number] Date of Birth: Jul 21, 1950 Attending MD: Barney Drain MD, MD CSN: [id number] Age: 69 Admit Type: Outpatient Procedure:                Colonoscopy WITH COLD SNARE POLYPECTOMY Indications:              Screening for colorectal malignant neoplasm Providers:                Barney Drain MD, MD, Otis Peak B. Sharon Seller, RN, Aram Candela Referring MD:             Fuller Canada Manuella Ghazi MD, MD Medicines:                Propofol per Anesthesia Complications:            No immediate complications. Estimated Blood Loss:     Estimated blood loss was minimal. Procedure:                Pre-Anesthesia Assessment:                           - Prior to the procedure, a History and Physical                            was performed, and patient medications and                            allergies were reviewed. The patient's tolerance of                            previous anesthesia was also reviewed. The risks                            and benefits of the procedure and the sedation                            options and risks were discussed with the patient.                            All questions were answered, and informed consent                            was obtained. Prior Anticoagulants: The patient has                            taken no previous anticoagulant or antiplatelet                            agents. ASA Grade Assessment: III - A patient with                            severe systemic disease. After reviewing the risks  and benefits, the patient was deemed in                            satisfactory condition to undergo the procedure.                            After obtaining informed consent, the colonoscope                            was passed under direct vision. Throughout the                            procedure, the patient's blood  pressure, pulse, and                            oxygen saturations were monitored continuously. The                            [id number]-[id number] ([id number]) scope was introduced through                            the anus and advanced to the the cecum, identified                            by appendiceal orifice and ileocecal valve. The                            colonoscopy was somewhat difficult due to a                            tortuous colon. Successful completion of the                            procedure was aided by straightening and shortening                            the scope to obtain bowel loop reduction and                            COLOWRAP. The patient tolerated the procedure well.                            The quality of the bowel preparation was excellent.                            The ileocecal valve, appendiceal orifice, and                            rectum were photographed. Scope In: 2:42:52 PM Scope Out: 2:59:24 PM Scope Withdrawal Time: 0 hours 13 minutes 35 seconds  Total Procedure Duration: 0 hours 16 minutes 32 seconds  Findings:      Two sessile polyps were found in the rectum and ascending colon. The       polyps were  3 to 4 mm in size. These polyps were removed with a cold       snare. Resection and retrieval were complete.      Multiple small and large-mouthed diverticula were found in the       recto-sigmoid colon, sigmoid colon and descending colon.      External and internal hemorrhoids were found. Impression:               - Two 3 to 4 mm polyps in the rectum and in the                            ascending colon, removed with a cold snare.                            Resected and retrieved.                           - Diverticulosis in the recto-sigmoid colon, in the                            sigmoid colon and in the descending colon.                           - External and internal hemorrhoids. Moderate Sedation:      Per Anesthesia  Care Recommendation:           - Patient has a contact number available for                            emergencies. The signs and symptoms of potential                            delayed complications were discussed with the                            patient. Return to normal activities tomorrow.                            Written discharge instructions were provided to the                            patient.                           - High fiber diet.                           - Continue present medications.                           - Await pathology results.                           - Repeat colonoscopy in 5-10 years for surveillance. Procedure Code(s):        --- Professional ---  45385, Colonoscopy, flexible; with removal of                            tumor(s), polyp(s), or other lesion(s) by snare                            technique Diagnosis Code(s):        --- Professional ---                           Z12.11, Encounter for screening for malignant                            neoplasm of colon                           K62.1, Rectal polyp                           K63.5, Polyp of colon                           K64.8, Other hemorrhoids                           K57.30, Diverticulosis of large intestine without                            perforation or abscess without bleeding CPT copyright 2019 American Medical Association. All rights reserved. The codes documented in this report are preliminary and upon coder review may  be revised to meet current compliance requirements. Barney Drain, MD Barney Drain MD, MD 02/11/2019 3:15:02 PM This report has been signed electronically. Number of Addenda: 0

## 2019-02-11 NOTE — Anesthesia Preprocedure Evaluation (Addendum)
Anesthesia Evaluation  Patient identified by MRN, date of birth, ID band Patient awake    Reviewed: Allergy & Precautions, NPO status , Patient's Chart, lab work & pertinent test results  Airway Mallampati: II  TM Distance: >3 FB Neck ROM: Full    Dental no notable dental hx. (+) Teeth Intact, Missing   Pulmonary neg pulmonary ROS,  Chart reports ILD Pt states only has asthma  Used inhaler this am  Otherwise states hasn't used in 5 months  Denies o2 use   Pulmonary exam normal breath sounds clear to auscultation       Cardiovascular Exercise Tolerance: Good negative cardio ROS Normal cardiovascular examI Rhythm:Regular Rate:Normal     Neuro/Psych negative neurological ROS  negative psych ROS   GI/Hepatic Neg liver ROS, GERD  Medicated and Controlled,  Endo/Other  negative endocrine ROS  Renal/GU negative Renal ROS  negative genitourinary   Musculoskeletal  (+) Arthritis , Rheumatoid disorders,    Abdominal   Peds negative pediatric ROS (+)  Hematology negative hematology ROS (+)   Anesthesia Other Findings   Reproductive/Obstetrics negative OB ROS                             Anesthesia Physical Anesthesia Plan  ASA: II  Anesthesia Plan: General   Post-op Pain Management:    Induction: Intravenous  PONV Risk Score and Plan: 3 and TIVA, Propofol infusion, Ondansetron and Treatment may vary due to age or medical condition  Airway Management Planned: Simple Face Mask and Nasal Cannula  Additional Equipment:   Intra-op Plan:   Post-operative Plan:   Informed Consent: I have reviewed the patients History and Physical, chart, labs and discussed the procedure including the risks, benefits and alternatives for the proposed anesthesia with the patient or authorized representative who has indicated his/her understanding and acceptance.     Dental advisory given  Plan  Discussed with: CRNA  Anesthesia Plan Comments: (Plan Full PPE use  Plan GA with GETA as needed d/w pt -WTP with same after Q&A)        Anesthesia Quick Evaluation

## 2019-02-11 NOTE — Transfer of Care (Signed)
Immediate Anesthesia Transfer of Care Note  Patient: Kimberly Livingston  Procedure(s) Performed: COLONOSCOPY WITH PROPOFOL (N/A ) POLYPECTOMY  Patient Location: PACU  Anesthesia Type:MAC  Level of Consciousness: awake, alert , oriented and patient cooperative  Airway & Oxygen Therapy: Patient Spontanous Breathing  Post-op Assessment: Report given to RN and Post -op Vital signs reviewed and stable  Post vital signs: Reviewed and stable  Last Vitals:  Vitals Value Taken Time  BP    Temp    Pulse 84 02/11/19 1507  Resp 17 02/11/19 1507  SpO2 96 % 02/11/19 1507  Vitals shown include unvalidated device data.  Last Pain:  Vitals:   02/11/19 1436  TempSrc:   PainSc: 0-No pain         Complications: No apparent anesthesia complications

## 2019-02-13 ENCOUNTER — Telehealth: Payer: Self-pay | Admitting: Gastroenterology

## 2019-02-13 LAB — SURGICAL PATHOLOGY

## 2019-02-13 NOTE — Telephone Encounter (Signed)
PLEASE CALL PT. SHE HAD TWO SIMPLE ADENOMAS REMOVED.    DRINK WATER TO KEEP YOUR URINE LIGHT YELLOW. CONTINUE YOUR WEIGHT LOSS EFFORTS. READ AND FOLLOW RECOMMENDATIONS BY DR. MARK HYMAN, "10-DAY DETOX DIET". FOLLOW A HIGH FIBER DIET. AVOID ITEMS THAT CAUSE BLOATING.   Next colonoscopy AT AGE 69.

## 2019-02-14 NOTE — Telephone Encounter (Signed)
Reminder in epic °

## 2019-02-17 ENCOUNTER — Telehealth: Payer: Self-pay | Admitting: Gastroenterology

## 2019-02-17 NOTE — Telephone Encounter (Signed)
Pt wants a call back on Tues.

## 2019-02-17 NOTE — Telephone Encounter (Signed)
LMOM to call.

## 2019-02-17 NOTE — Telephone Encounter (Signed)
See previous note

## 2019-02-17 NOTE — Telephone Encounter (Signed)
Pt was returning a call from DS. I told her DS was at lunch. Pt said to call her back tomorrow.

## 2019-02-18 ENCOUNTER — Telehealth: Payer: Self-pay | Admitting: Gastroenterology

## 2019-02-18 NOTE — Telephone Encounter (Signed)
See results note. Pt is aware.

## 2019-02-18 NOTE — Telephone Encounter (Signed)
LMOM to call.

## 2019-02-18 NOTE — Telephone Encounter (Signed)
Pt is aware.  

## 2019-02-18 NOTE — Telephone Encounter (Signed)
PATIENT RETURNED CALL, PLEASE CALL BACK  °

## 2019-03-28 DIAGNOSIS — R0683 Snoring: Secondary | ICD-10-CM | POA: Diagnosis not present

## 2019-03-28 DIAGNOSIS — M069 Rheumatoid arthritis, unspecified: Secondary | ICD-10-CM | POA: Diagnosis not present

## 2019-03-28 DIAGNOSIS — Z789 Other specified health status: Secondary | ICD-10-CM | POA: Diagnosis not present

## 2019-03-28 DIAGNOSIS — I10 Essential (primary) hypertension: Secondary | ICD-10-CM | POA: Diagnosis not present

## 2019-03-28 DIAGNOSIS — Z299 Encounter for prophylactic measures, unspecified: Secondary | ICD-10-CM | POA: Diagnosis not present

## 2019-03-28 DIAGNOSIS — Z2821 Immunization not carried out because of patient refusal: Secondary | ICD-10-CM | POA: Diagnosis not present

## 2019-05-22 DIAGNOSIS — H524 Presbyopia: Secondary | ICD-10-CM | POA: Diagnosis not present

## 2019-05-22 DIAGNOSIS — H16223 Keratoconjunctivitis sicca, not specified as Sjogren's, bilateral: Secondary | ICD-10-CM | POA: Diagnosis not present

## 2019-05-22 DIAGNOSIS — H25813 Combined forms of age-related cataract, bilateral: Secondary | ICD-10-CM | POA: Diagnosis not present

## 2019-05-22 DIAGNOSIS — H353131 Nonexudative age-related macular degeneration, bilateral, early dry stage: Secondary | ICD-10-CM | POA: Diagnosis not present

## 2019-05-29 DIAGNOSIS — H353132 Nonexudative age-related macular degeneration, bilateral, intermediate dry stage: Secondary | ICD-10-CM | POA: Diagnosis not present

## 2019-05-29 DIAGNOSIS — H25811 Combined forms of age-related cataract, right eye: Secondary | ICD-10-CM | POA: Diagnosis not present

## 2019-05-29 DIAGNOSIS — Z01818 Encounter for other preprocedural examination: Secondary | ICD-10-CM | POA: Diagnosis not present

## 2019-06-20 DIAGNOSIS — H25811 Combined forms of age-related cataract, right eye: Secondary | ICD-10-CM | POA: Diagnosis not present

## 2019-06-20 DIAGNOSIS — H2511 Age-related nuclear cataract, right eye: Secondary | ICD-10-CM | POA: Diagnosis not present

## 2019-07-24 DIAGNOSIS — Z1339 Encounter for screening examination for other mental health and behavioral disorders: Secondary | ICD-10-CM | POA: Diagnosis not present

## 2019-07-24 DIAGNOSIS — Z299 Encounter for prophylactic measures, unspecified: Secondary | ICD-10-CM | POA: Diagnosis not present

## 2019-07-24 DIAGNOSIS — Z0000 Encounter for general adult medical examination without abnormal findings: Secondary | ICD-10-CM | POA: Diagnosis not present

## 2019-07-24 DIAGNOSIS — Z6829 Body mass index (BMI) 29.0-29.9, adult: Secondary | ICD-10-CM | POA: Diagnosis not present

## 2019-07-24 DIAGNOSIS — R5383 Other fatigue: Secondary | ICD-10-CM | POA: Diagnosis not present

## 2019-07-24 DIAGNOSIS — Z7189 Other specified counseling: Secondary | ICD-10-CM | POA: Diagnosis not present

## 2019-07-24 DIAGNOSIS — Z1331 Encounter for screening for depression: Secondary | ICD-10-CM | POA: Diagnosis not present

## 2019-07-24 DIAGNOSIS — E7800 Pure hypercholesterolemia, unspecified: Secondary | ICD-10-CM | POA: Diagnosis not present

## 2019-07-24 DIAGNOSIS — I10 Essential (primary) hypertension: Secondary | ICD-10-CM | POA: Diagnosis not present

## 2019-07-25 DIAGNOSIS — Z299 Encounter for prophylactic measures, unspecified: Secondary | ICD-10-CM | POA: Diagnosis not present

## 2019-07-25 DIAGNOSIS — M25571 Pain in right ankle and joints of right foot: Secondary | ICD-10-CM | POA: Diagnosis not present

## 2019-07-25 DIAGNOSIS — M069 Rheumatoid arthritis, unspecified: Secondary | ICD-10-CM | POA: Diagnosis not present

## 2019-07-25 DIAGNOSIS — E7800 Pure hypercholesterolemia, unspecified: Secondary | ICD-10-CM | POA: Diagnosis not present

## 2019-07-25 DIAGNOSIS — R5383 Other fatigue: Secondary | ICD-10-CM | POA: Diagnosis not present

## 2019-07-25 DIAGNOSIS — N184 Chronic kidney disease, stage 4 (severe): Secondary | ICD-10-CM | POA: Diagnosis not present

## 2019-07-25 DIAGNOSIS — M79641 Pain in right hand: Secondary | ICD-10-CM | POA: Diagnosis not present

## 2019-07-25 DIAGNOSIS — M79642 Pain in left hand: Secondary | ICD-10-CM | POA: Diagnosis not present

## 2019-07-31 DIAGNOSIS — D72829 Elevated white blood cell count, unspecified: Secondary | ICD-10-CM | POA: Diagnosis not present

## 2019-07-31 DIAGNOSIS — K219 Gastro-esophageal reflux disease without esophagitis: Secondary | ICD-10-CM | POA: Diagnosis not present

## 2019-07-31 DIAGNOSIS — I10 Essential (primary) hypertension: Secondary | ICD-10-CM | POA: Diagnosis not present

## 2019-07-31 DIAGNOSIS — Z6829 Body mass index (BMI) 29.0-29.9, adult: Secondary | ICD-10-CM | POA: Diagnosis not present

## 2019-07-31 DIAGNOSIS — Z299 Encounter for prophylactic measures, unspecified: Secondary | ICD-10-CM | POA: Diagnosis not present

## 2019-07-31 DIAGNOSIS — N183 Chronic kidney disease, stage 3 unspecified: Secondary | ICD-10-CM | POA: Diagnosis not present

## 2019-08-01 DIAGNOSIS — M2550 Pain in unspecified joint: Secondary | ICD-10-CM | POA: Diagnosis not present

## 2019-08-01 DIAGNOSIS — M0579 Rheumatoid arthritis with rheumatoid factor of multiple sites without organ or systems involvement: Secondary | ICD-10-CM | POA: Diagnosis not present

## 2019-08-01 DIAGNOSIS — Z79899 Other long term (current) drug therapy: Secondary | ICD-10-CM | POA: Diagnosis not present

## 2019-08-01 DIAGNOSIS — Z6829 Body mass index (BMI) 29.0-29.9, adult: Secondary | ICD-10-CM | POA: Diagnosis not present

## 2019-08-01 DIAGNOSIS — E663 Overweight: Secondary | ICD-10-CM | POA: Diagnosis not present

## 2019-08-11 DIAGNOSIS — M859 Disorder of bone density and structure, unspecified: Secondary | ICD-10-CM | POA: Diagnosis not present

## 2019-08-11 DIAGNOSIS — Z79899 Other long term (current) drug therapy: Secondary | ICD-10-CM | POA: Diagnosis not present

## 2019-08-11 DIAGNOSIS — Z7952 Long term (current) use of systemic steroids: Secondary | ICD-10-CM | POA: Diagnosis not present

## 2019-08-11 DIAGNOSIS — E2839 Other primary ovarian failure: Secondary | ICD-10-CM | POA: Diagnosis not present

## 2019-08-25 DIAGNOSIS — H2512 Age-related nuclear cataract, left eye: Secondary | ICD-10-CM | POA: Diagnosis not present

## 2019-08-25 DIAGNOSIS — H25812 Combined forms of age-related cataract, left eye: Secondary | ICD-10-CM | POA: Diagnosis not present

## 2019-09-03 DIAGNOSIS — H2513 Age-related nuclear cataract, bilateral: Secondary | ICD-10-CM | POA: Diagnosis not present

## 2019-09-17 ENCOUNTER — Inpatient Hospital Stay (HOSPITAL_COMMUNITY): Payer: Medicare HMO

## 2019-09-17 ENCOUNTER — Emergency Department (HOSPITAL_COMMUNITY): Payer: Medicare HMO

## 2019-09-17 ENCOUNTER — Other Ambulatory Visit: Payer: Self-pay

## 2019-09-17 ENCOUNTER — Encounter (HOSPITAL_COMMUNITY): Payer: Self-pay | Admitting: *Deleted

## 2019-09-17 ENCOUNTER — Inpatient Hospital Stay (HOSPITAL_COMMUNITY)
Admission: EM | Admit: 2019-09-17 | Discharge: 2019-09-19 | DRG: 177 | Disposition: A | Discharge status: Home / Self Care | Payer: Medicare HMO | Attending: Internal Medicine | Admitting: Internal Medicine

## 2019-09-17 DIAGNOSIS — N179 Acute kidney failure, unspecified: Secondary | ICD-10-CM | POA: Diagnosis present

## 2019-09-17 DIAGNOSIS — K219 Gastro-esophageal reflux disease without esophagitis: Secondary | ICD-10-CM | POA: Diagnosis not present

## 2019-09-17 DIAGNOSIS — E876 Hypokalemia: Secondary | ICD-10-CM | POA: Diagnosis present

## 2019-09-17 DIAGNOSIS — R609 Edema, unspecified: Secondary | ICD-10-CM

## 2019-09-17 DIAGNOSIS — N1832 Chronic kidney disease, stage 3b: Secondary | ICD-10-CM | POA: Diagnosis present

## 2019-09-17 DIAGNOSIS — Z880 Allergy status to penicillin: Secondary | ICD-10-CM | POA: Diagnosis not present

## 2019-09-17 DIAGNOSIS — Z823 Family history of stroke: Secondary | ICD-10-CM

## 2019-09-17 DIAGNOSIS — J9601 Acute respiratory failure with hypoxia: Secondary | ICD-10-CM | POA: Diagnosis present

## 2019-09-17 DIAGNOSIS — E861 Hypovolemia: Secondary | ICD-10-CM | POA: Diagnosis present

## 2019-09-17 DIAGNOSIS — Z7952 Long term (current) use of systemic steroids: Secondary | ICD-10-CM

## 2019-09-17 DIAGNOSIS — Z79899 Other long term (current) drug therapy: Secondary | ICD-10-CM | POA: Diagnosis not present

## 2019-09-17 DIAGNOSIS — J849 Interstitial pulmonary disease, unspecified: Secondary | ICD-10-CM | POA: Diagnosis present

## 2019-09-17 DIAGNOSIS — R7989 Other specified abnormal findings of blood chemistry: Secondary | ICD-10-CM | POA: Diagnosis not present

## 2019-09-17 DIAGNOSIS — J309 Allergic rhinitis, unspecified: Secondary | ICD-10-CM | POA: Diagnosis present

## 2019-09-17 DIAGNOSIS — R0689 Other abnormalities of breathing: Secondary | ICD-10-CM | POA: Diagnosis not present

## 2019-09-17 DIAGNOSIS — R0902 Hypoxemia: Secondary | ICD-10-CM | POA: Diagnosis not present

## 2019-09-17 DIAGNOSIS — K760 Fatty (change of) liver, not elsewhere classified: Secondary | ICD-10-CM | POA: Diagnosis present

## 2019-09-17 DIAGNOSIS — Z8249 Family history of ischemic heart disease and other diseases of the circulatory system: Secondary | ICD-10-CM | POA: Diagnosis not present

## 2019-09-17 DIAGNOSIS — M7989 Other specified soft tissue disorders: Secondary | ICD-10-CM | POA: Diagnosis not present

## 2019-09-17 DIAGNOSIS — U071 COVID-19: Secondary | ICD-10-CM | POA: Diagnosis not present

## 2019-09-17 DIAGNOSIS — R000 Tachycardia, unspecified: Secondary | ICD-10-CM | POA: Diagnosis not present

## 2019-09-17 DIAGNOSIS — R0602 Shortness of breath: Secondary | ICD-10-CM | POA: Diagnosis not present

## 2019-09-17 DIAGNOSIS — E860 Dehydration: Secondary | ICD-10-CM | POA: Diagnosis present

## 2019-09-17 DIAGNOSIS — R069 Unspecified abnormalities of breathing: Secondary | ICD-10-CM | POA: Diagnosis not present

## 2019-09-17 DIAGNOSIS — M069 Rheumatoid arthritis, unspecified: Secondary | ICD-10-CM | POA: Diagnosis present

## 2019-09-17 DIAGNOSIS — J1282 Pneumonia due to coronavirus disease 2019: Secondary | ICD-10-CM | POA: Diagnosis present

## 2019-09-17 LAB — COMPREHENSIVE METABOLIC PANEL
ALT: 29 U/L (ref 0–44)
AST: 61 U/L — ABNORMAL HIGH (ref 15–41)
Albumin: 3.2 g/dL — ABNORMAL LOW (ref 3.5–5.0)
Alkaline Phosphatase: 62 U/L (ref 38–126)
Anion gap: 14 (ref 5–15)
BUN: 20 mg/dL (ref 8–23)
CO2: 19 mmol/L — ABNORMAL LOW (ref 22–32)
Calcium: 8.4 mg/dL — ABNORMAL LOW (ref 8.9–10.3)
Chloride: 102 mmol/L (ref 98–111)
Creatinine, Ser: 1.38 mg/dL — ABNORMAL HIGH (ref 0.44–1.00)
GFR calc Af Amer: 45 mL/min — ABNORMAL LOW (ref >60)
GFR calc non Af Amer: 39 mL/min — ABNORMAL LOW (ref >60)
Glucose, Bld: 97 mg/dL (ref 70–99)
Potassium: 3.4 mmol/L — ABNORMAL LOW (ref 3.5–5.1)
Sodium: 135 mmol/L (ref 135–145)
Total Bilirubin: 1.2 mg/dL (ref 0.3–1.2)
Total Protein: 7.9 g/dL (ref 6.5–8.1)

## 2019-09-17 LAB — CBC WITH DIFFERENTIAL/PLATELET
Abs Immature Granulocytes: 0.06 10*3/uL (ref 0.00–0.07)
Basophils Absolute: 0 10*3/uL (ref 0.0–0.1)
Basophils Relative: 0 %
Eosinophils Absolute: 0 10*3/uL (ref 0.0–0.5)
Eosinophils Relative: 0 %
HCT: 46 % (ref 36.0–46.0)
Hemoglobin: 15.4 g/dL — ABNORMAL HIGH (ref 12.0–15.0)
Immature Granulocytes: 1 %
Lymphocytes Relative: 13 %
Lymphs Abs: 1.1 10*3/uL (ref 0.7–4.0)
MCH: 26.8 pg (ref 26.0–34.0)
MCHC: 33.5 g/dL (ref 30.0–36.0)
MCV: 80.1 fL (ref 80.0–100.0)
Monocytes Absolute: 1.1 10*3/uL — ABNORMAL HIGH (ref 0.1–1.0)
Monocytes Relative: 13 %
Neutro Abs: 6.6 10*3/uL (ref 1.7–7.7)
Neutrophils Relative %: 73 %
Platelets: 234 10*3/uL (ref 150–400)
RBC: 5.74 MIL/uL — ABNORMAL HIGH (ref 3.87–5.11)
RDW: 18.4 % — ABNORMAL HIGH (ref 11.5–15.5)
WBC: 8.9 10*3/uL (ref 4.0–10.5)
nRBC: 0 % (ref 0.0–0.2)

## 2019-09-17 LAB — TRIGLYCERIDES: Triglycerides: 103 mg/dL (ref <150)

## 2019-09-17 LAB — BLOOD GAS, ARTERIAL
Acid-base deficit: 3.9 mmol/L — ABNORMAL HIGH (ref 0.0–2.0)
Bicarbonate: 21.8 mmol/L (ref 20.0–28.0)
FIO2: 36
O2 Saturation: 90.8 %
Patient temperature: 37.4
pCO2 arterial: 31.6 mmHg — ABNORMAL LOW (ref 32.0–48.0)
pH, Arterial: 7.416 (ref 7.350–7.450)
pO2, Arterial: 63.3 mmHg — ABNORMAL LOW (ref 83.0–108.0)

## 2019-09-17 LAB — LACTIC ACID, PLASMA: Lactic Acid, Venous: 1.6 mmol/L (ref 0.5–1.9)

## 2019-09-17 LAB — FIBRINOGEN: Fibrinogen: 504 mg/dL — ABNORMAL HIGH (ref 210–475)

## 2019-09-17 LAB — FERRITIN: Ferritin: 1024 ng/mL — ABNORMAL HIGH (ref 11–307)

## 2019-09-17 LAB — BRAIN NATRIURETIC PEPTIDE: B Natriuretic Peptide: 45 pg/mL (ref 0.0–100.0)

## 2019-09-17 LAB — PROCALCITONIN: Procalcitonin: 0.11 ng/mL

## 2019-09-17 LAB — SARS CORONAVIRUS 2 BY RT PCR (HOSPITAL ORDER, PERFORMED IN ~~LOC~~ HOSPITAL LAB): SARS Coronavirus 2: POSITIVE — AB

## 2019-09-17 LAB — D-DIMER, QUANTITATIVE: D-Dimer, Quant: 9.7 ug/mL-FEU — ABNORMAL HIGH (ref 0.00–0.50)

## 2019-09-17 LAB — C-REACTIVE PROTEIN: CRP: 14.2 mg/dL — ABNORMAL HIGH (ref <1.0)

## 2019-09-17 LAB — LACTATE DEHYDROGENASE: LDH: 325 U/L — ABNORMAL HIGH (ref 98–192)

## 2019-09-17 MED ORDER — SODIUM CHLORIDE 0.45 % IV BOLUS
250.0000 mL | Freq: Once | INTRAVENOUS | Status: AC
  Administered 2019-09-17: 250 mL via INTRAVENOUS

## 2019-09-17 MED ORDER — IOHEXOL 350 MG/ML SOLN
100.0000 mL | Freq: Once | INTRAVENOUS | Status: AC | PRN
  Administered 2019-09-17: 65 mL via INTRAVENOUS

## 2019-09-17 MED ORDER — HYDROCOD POLST-CPM POLST ER 10-8 MG/5ML PO SUER: 5.0000 mL | Freq: Two times a day (BID) | ORAL | Status: DC | PRN

## 2019-09-17 MED ORDER — ZINC SULFATE 220 (50 ZN) MG PO CAPS
220.0000 mg | ORAL_CAPSULE | Freq: Every day | ORAL | Status: DC
  Administered 2019-09-17 – 2019-09-18 (×2): 220 mg via ORAL
  Filled 2019-09-17 (×2): qty 1

## 2019-09-17 MED ORDER — DEXAMETHASONE SODIUM PHOSPHATE 10 MG/ML IJ SOLN
10.0000 mg | Freq: Once | INTRAMUSCULAR | Status: AC
  Administered 2019-09-17: 10 mg via INTRAVENOUS
  Filled 2019-09-17: qty 1

## 2019-09-17 MED ORDER — ONDANSETRON HCL 4 MG/2ML IJ SOLN: 4.0000 mg | Freq: Four times a day (QID) | INTRAMUSCULAR | Status: DC | PRN

## 2019-09-17 MED ORDER — SODIUM CHLORIDE 0.9 % IV SOLN
INTRAVENOUS | Status: DC | PRN
  Administered 2019-09-17: 1000 mL via INTRAVENOUS

## 2019-09-17 MED ORDER — ALBUTEROL SULFATE HFA 108 (90 BASE) MCG/ACT IN AERS: 2.0000 | INHALATION_SPRAY | Freq: Four times a day (QID) | RESPIRATORY_TRACT | Status: DC | PRN

## 2019-09-17 MED ORDER — POTASSIUM CHLORIDE CRYS ER 20 MEQ PO TBCR
40.0000 meq | EXTENDED_RELEASE_TABLET | Freq: Once | ORAL | Status: AC
  Administered 2019-09-17: 40 meq via ORAL
  Filled 2019-09-17: qty 2

## 2019-09-17 MED ORDER — ENOXAPARIN SODIUM 40 MG/0.4ML ~~LOC~~ SOLN
40.0000 mg | SUBCUTANEOUS | Status: DC
  Administered 2019-09-17 – 2019-09-18 (×2): 40 mg via SUBCUTANEOUS
  Filled 2019-09-17 (×2): qty 0.4

## 2019-09-17 MED ORDER — PAROXETINE HCL 20 MG PO TABS
40.0000 mg | ORAL_TABLET | Freq: Every day | ORAL | Status: DC
  Administered 2019-09-18 – 2019-09-19 (×2): 40 mg via ORAL
  Filled 2019-09-17 (×4): qty 2

## 2019-09-17 MED ORDER — FOLIC ACID 1 MG PO TABS
1.0000 mg | ORAL_TABLET | Freq: Two times a day (BID) | ORAL | Status: DC
  Administered 2019-09-17 – 2019-09-19 (×4): 1 mg via ORAL
  Filled 2019-09-17 (×4): qty 1

## 2019-09-17 MED ORDER — ONDANSETRON HCL 4 MG PO TABS: 4.0000 mg | ORAL_TABLET | Freq: Four times a day (QID) | ORAL | Status: DC | PRN

## 2019-09-17 MED ORDER — ASCORBIC ACID 500 MG PO TABS
500.0000 mg | ORAL_TABLET | Freq: Every day | ORAL | Status: DC
  Administered 2019-09-17 – 2019-09-18 (×2): 500 mg via ORAL
  Filled 2019-09-17 (×2): qty 1

## 2019-09-17 MED ORDER — GUAIFENESIN-DM 100-10 MG/5ML PO SYRP
10.0000 mL | ORAL_SOLUTION | ORAL | Status: DC | PRN
  Administered 2019-09-18: 10 mL via ORAL
  Filled 2019-09-17: qty 10

## 2019-09-17 MED ORDER — METHYLPREDNISOLONE SODIUM SUCC 125 MG IJ SOLR
60.0000 mg | Freq: Two times a day (BID) | INTRAMUSCULAR | Status: DC
  Administered 2019-09-17 – 2019-09-19 (×4): 60 mg via INTRAVENOUS
  Filled 2019-09-17 (×4): qty 2

## 2019-09-17 MED ORDER — ACETAMINOPHEN 325 MG PO TABS: 650.0000 mg | ORAL_TABLET | Freq: Four times a day (QID) | ORAL | Status: DC | PRN

## 2019-09-17 MED ORDER — SODIUM CHLORIDE 0.9 % IV SOLN
100.0000 mg | INTRAVENOUS | Status: AC
  Administered 2019-09-17 (×2): 100 mg via INTRAVENOUS
  Filled 2019-09-17 (×2): qty 20

## 2019-09-17 MED ORDER — FAMOTIDINE 20 MG PO TABS: 20.0000 mg | ORAL_TABLET | Freq: Every day | ORAL | Status: DC | PRN

## 2019-09-17 MED ORDER — SODIUM CHLORIDE 0.9 % IV SOLN
100.0000 mg | Freq: Every day | INTRAVENOUS | Status: DC
  Administered 2019-09-18 – 2019-09-19 (×2): 100 mg via INTRAVENOUS
  Filled 2019-09-17: qty 20

## 2019-09-17 MED ORDER — SODIUM CHLORIDE 0.45 % IV SOLN: INTRAVENOUS | Status: AC

## 2019-09-17 NOTE — ED Provider Notes (Signed)
[facility] EMERGENCY DEPARTMENT Provider Note   CSN: [id number] Arrival date & time: 09/17/19  1258     History Chief Complaint  Patient presents with  . Shortness of Breath    Kimberly Livingston is a 69 y.o. female with a past medical history of allergies, fatty liver, reflux, RA, chronic kidney disease, interstitial lung disease who presents the emergency department with chief complaint of shortness of breath.  There is a level 5 caveat is a patient is confused.  She reports that she has been having some trouble breathing.  She has been having problems with her "allergies" but gives varying length of time of symptoms.  She does admit to having nausea, vomiting and diarrhea intermittently.  She denies being vaccinated against the coronavirus.  Unable to obtain any further information from the patient.  Further history obtained from EMS reports that the patient was found to be 80% on room air at home.  She was placed on nonrebreather at which point they were able to bring the patient's oxygen up to between 90 and 94%.  Further the patient is chronically immunocompromised on methotrexate and prednisone.  HPI     Past Medical History:  Diagnosis Date  . Allergic rhinitis   . Fatty liver   . GERD (gastroesophageal reflux disease)   . ILD (interstitial lung disease) ([place])   . Rheumatoid arthritis [facility])     Patient Active Problem List   Diagnosis Date Noted  . Pneumonia due to COVID-19 virus 09/17/2019  . Acute respiratory failure with hypoxia ([place]) 09/17/2019  . Hypokalemia 09/17/2019  . Colon cancer screening 11/04/2018  . Gastroesophageal reflux disease 09/26/2018  . Dysphagia 09/26/2018  . Constipation 09/26/2018  . Rheumatoid arthritis ([place]) 07/06/2017  . ILD (interstitial lung disease) ([place]) 05/26/2017    Past Surgical History:  Procedure Laterality Date  . BREAST SURGERY Bilateral    implants  . COLONOSCOPY  2014  . COLONOSCOPY WITH PROPOFOL N/A 02/11/2019    Procedure: COLONOSCOPY WITH PROPOFOL;  Surgeon: Danie Binder, MD;  Location: [facility] ENDO SUITE;  Service: Endoscopy;  Laterality: N/A;  2:00pm  . FOOT SURGERY Bilateral    bunions  . POLYPECTOMY  02/11/2019   Procedure: POLYPECTOMY;  Surgeon: Danie Binder, MD;  Location: [facility] ENDO SUITE;  Service: Endoscopy;;     OB History   No obstetric history on file.     Family History  Problem Relation Age of Onset  . Stroke Mother   . Heart disease Father   . Allergic rhinitis Sister   . Allergic rhinitis Brother   . Heart attack Brother   . Colon cancer Neg Hx   . Colon polyps Neg Hx     Social History   Tobacco Use  . Smoking status: Never Smoker  . Smokeless tobacco: Never Used  Vaping Use  . Vaping Use: Never used  Substance Use Topics  . Alcohol use: No  . Drug use: No    Home Medications Prior to Admission medications   Medication Sig Start Date End Date Taking? Authorizing Provider  albuterol (PROVENTIL HFA;VENTOLIN HFA) 108 (90 Base) MCG/ACT inhaler Inhale 2 puffs into the lungs every 6 (six) hours as needed for wheezing or shortness of breath. 07/06/17  Yes Mannam, Praveen, MD  albuterol (PROVENTIL) (2.5 MG/3ML) 0.083% nebulizer solution Take 2.5 mg by nebulization every 6 (six) hours as needed for wheezing or shortness of breath.   Yes [provider]  calcium carbonate (TUMS - DOSED IN MG  ELEMENTAL CALCIUM) 500 MG chewable tablet Chew 1 tablet by mouth 3 (three) times daily as needed for indigestion or heartburn.    Yes [provider]  dextromethorphan-guaiFENesin (MUCINEX DM) 30-600 MG 12hr tablet Take 1 tablet by mouth 2 (two) times daily as needed (cough/congestion.).    Yes [provider]  famotidine (PEPCID) 20 MG tablet TAKE 1 TABLET BY MOUTH EVERYDAY AT BEDTIME Patient taking differently: Take 20 mg by mouth daily as needed (indigestion/heartburn.).  12/26/18  Yes Annitta Needs, NP  folic acid (FOLVITE) 1 MG tablet Take 1 mg by mouth 2  (two) times daily.    Yes [provider]  methotrexate (RHEUMATREX) 2.5 MG tablet Take 20 mg by mouth every Wednesday. 8 tablets   Yes [provider]  omeprazole (PRILOSEC) 40 MG capsule TAKE 1 CAPSULE BY MOUTH EVERY DAY Patient taking differently: Take 40 mg by mouth daily before breakfast.  12/26/18  Yes Annitta Needs, NP  pantoprazole (PROTONIX) 40 MG tablet Take 40 mg by mouth daily as needed for heartburn. 12/24/18  Yes [provider]  PARoxetine (PAXIL) 40 MG tablet Take 40 mg by mouth daily.    Yes [provider]  PAZEO 0.7 % SOLN Place 1-2 drops into both eyes 3 (three) times daily as needed (allergies.).  06/26/15  Yes [provider]  predniSONE (DELTASONE) 5 MG tablet Take 5 mg by mouth daily with breakfast.    Yes [provider]  traZODone (DESYREL) 50 MG tablet Take 50 mg by mouth daily. 07/01/15  Yes [provider]    Allergies    Penicillins  Review of Systems   Review of Systems  Unable to perform ROS: Mental status change    Physical Exam Updated Vital Signs BP 119/66 (BP Location: Left Arm)   Pulse 89   Temp 98.9 F (37.2 C)   Resp 18   SpO2 95%   Physical Exam Vitals and nursing note reviewed.  Constitutional:      General: She is not in acute distress.    Appearance: She is well-developed. She is not diaphoretic.     Comments:   Patient appears weak, unkempt.  Hair is matted in the back  HENT:     Head: Normocephalic and atraumatic.  Eyes:     General: No scleral icterus.    Extraocular Movements: Extraocular movements intact.     Conjunctiva/sclera: Conjunctivae normal.     Pupils: Pupils are equal, round, and reactive to light.  Cardiovascular:     Rate and Rhythm: Normal rate and regular rhythm.     Heart sounds: Normal heart sounds. No murmur heard.  No friction rub. No gallop.   Pulmonary:     Effort: Pulmonary effort is normal. Tachypnea present. No respiratory distress.      Breath sounds: Normal breath sounds.     Comments: Diffuse coarse and fine crackles throughout all lung fields Abdominal:     General: Bowel sounds are normal. There is no distension.     Palpations: Abdomen is soft. There is no mass.     Tenderness: There is no abdominal tenderness. There is no guarding.  Musculoskeletal:     Cervical back: Normal range of motion.     Right lower leg: No edema.     Left lower leg: No edema.  Skin:    General: Skin is warm and dry.  Neurological:     Mental Status: She is alert.     Comments: Patient  oriented to person and place only  Psychiatric:        Behavior: Behavior normal.     ED Results / Procedures / Treatments   Labs (all labs ordered are listed, but only abnormal results are displayed) Labs Reviewed  SARS CORONAVIRUS 2 BY RT PCR ([facility]) - Abnormal; Notable for the following components:      Result Value   SARS Coronavirus 2 POSITIVE (*)    All other components within normal limits  CBC WITH DIFFERENTIAL/PLATELET - Abnormal; Notable for the following components:   RBC 5.74 (*)    Hemoglobin 15.4 (*)    RDW 18.4 (*)    Monocytes Absolute 1.1 (*)    All other components within normal limits  COMPREHENSIVE METABOLIC PANEL - Abnormal; Notable for the following components:   Potassium 3.4 (*)    CO2 19 (*)    Creatinine, Ser 1.38 (*)    Calcium 8.4 (*)    Albumin 3.2 (*)    AST 61 (*)    GFR calc non Af Amer 39 (*)    GFR calc Af Amer 45 (*)    All other components within normal limits  D-DIMER, QUANTITATIVE (NOT AT [facility]) - Abnormal; Notable for the following components:   D-Dimer, Quant 9.70 (*)    All other components within normal limits  LACTATE DEHYDROGENASE - Abnormal; Notable for the following components:   LDH 325 (*)    All other components within normal limits  FERRITIN - Abnormal; Notable for the following components:   Ferritin 1,024 (*)    All other components  within normal limits  FIBRINOGEN - Abnormal; Notable for the following components:   Fibrinogen 504 (*)    All other components within normal limits  C-REACTIVE PROTEIN - Abnormal; Notable for the following components:   CRP 14.2 (*)    All other components within normal limits  BLOOD GAS, ARTERIAL - Abnormal; Notable for the following components:   pCO2 arterial 31.6 (*)    pO2, Arterial 63.3 (*)    Acid-base deficit 3.9 (*)    Allens test (pass/fail) LEFT BRACHIAL (*)    All other components within normal limits  CULTURE, BLOOD (ROUTINE X 2)  CULTURE, BLOOD (ROUTINE X 2)  LACTIC ACID, PLASMA  PROCALCITONIN  TRIGLYCERIDES  BRAIN NATRIURETIC PEPTIDE  HIV ANTIBODY (ROUTINE TESTING W REFLEX)  CBC WITH DIFFERENTIAL/PLATELET  COMPREHENSIVE METABOLIC PANEL  C-REACTIVE PROTEIN  D-DIMER, QUANTITATIVE (NOT AT [facility])    EKG EKG Interpretation  Date/Time:  Wednesday September 17 2019 13:38:18 EDT Ventricular Rate:  98 PR Interval:  124 QRS Duration: 76 QT Interval:  338 QTC Calculation: 431 R Axis:   6 Text Interpretation: Normal sinus rhythm No significant change since last tracing Confirmed by Lajean Saver [phone]) on 09/17/2019 1:47:49 PM   Radiology CT ANGIO CHEST PE W OR WO CONTRAST  Result Date: 09/17/2019 CLINICAL DATA:  Shortness of breath and nonproductive cough for 3 weeks. Positive COVID test today. EXAM: CT ANGIOGRAPHY CHEST WITH CONTRAST TECHNIQUE: Multidetector CT imaging of the chest was performed using the standard protocol during bolus administration of intravenous contrast. Multiplanar CT image reconstructions and MIPs were obtained to evaluate the vascular anatomy. CONTRAST:  41mL OMNIPAQUE IOHEXOL 350 MG/ML SOLN COMPARISON:  07/15/2015 FINDINGS: Cardiovascular: The heart is borderline enlarged. No pericardial effusion. Mild tortuosity and calcification of the thoracic aorta but no focal aneurysm or dissection. Three-vessel coronary artery calcifications are noted. The  pulmonary arterial  tree is well opacified. No filling defects to suggest pulmonary embolism. Mediastinum/Nodes: Mediastinal and hilar lymphadenopathy likely related to the patient's COVID pneumonia. The esophagus is grossly normal. Lungs/Pleura: Extensive patchy largely peripheral ground-glass infiltrates consistent with extensive COVID pneumonia. No pleural effusions. Upper Abdomen: No significant upper abdominal findings. There is diffuse and fairly marked fatty infiltration of the liver noted. Small hiatal hernia noted. Musculoskeletal: Bilateral breast prostheses are noted. No breast masses or supraclavicular adenopathy. The thyroid gland is grossly normal. Borderline left axillary lymph node at 9 mm. No significant bony findings. Review of the MIP images confirms the above findings. IMPRESSION: 1. No CT findings for pulmonary embolism. 2. Mild tortuosity and calcification of the thoracic aorta but no focal aneurysm or dissection. 3. Three-vessel coronary artery calcifications. 4. Extensive patchy, largely peripheral, ground-glass infiltrates consistent with extensive COVID pneumonia. 5. Mediastinal and hilar lymphadenopathy, likely related to the patient's COVID pneumonia. 6. Diffuse and fairly marked fatty infiltration of the liver. 7. Aortic atherosclerosis. Aortic Atherosclerosis (ICD10-I70.0). Electronically Signed   By: Marijo Sanes M.D.   On: 09/17/2019 20:11   DG Chest Port 1 View  Result Date: 09/17/2019 CLINICAL DATA:  Hypoxia, sickness for 3 weeks EXAM: PORTABLE CHEST 1 VIEW COMPARISON:  07/15/2015 FINDINGS: Single frontal view of the chest demonstrates a stable cardiac silhouette. There are multifocal bilateral areas of airspace disease, superimposed upon chronic interstitial scarring. No effusion or pneumothorax. No acute bony abnormalities. IMPRESSION: 1. Multifocal bilateral airspace disease, favor atypical infection over edema. Pattern is consistent with COVID-19. Electronically Signed   By:  Randa Ngo M.D.   On: 09/17/2019 15:02    Procedures .Critical Care Performed by: Margarita Mail, PA-C Authorized by: Margarita Mail, PA-C   Critical care provider statement:    Critical care time (minutes):  50   Critical care time was exclusive of:  Separately billable procedures and treating other patients   Critical care was necessary to treat or prevent imminent or life-threatening deterioration of the following conditions:  Respiratory failure   Critical care was time spent personally by me on the following activities:  Discussions with consultants, evaluation of patient's response to treatment, examination of patient, ordering and performing treatments and interventions, ordering and review of laboratory studies, ordering and review of radiographic studies, pulse oximetry, re-evaluation of patient's condition, obtaining history from patient or surrogate and review of old charts   (including critical care time)  Medications Ordered in ED Medications  remdesivir 100 mg in sodium chloride 0.9 % 100 mL IVPB (has no administration in time range)  0.9 %  sodium chloride infusion (1,000 mLs Intravenous New Bag/Given 09/17/19 1724)  0.45 % sodium chloride infusion ( Intravenous New Bag/Given 09/17/19 1842)  PARoxetine (PAXIL) tablet 40 mg (has no administration in time range)  famotidine (PEPCID) tablet 20 mg (has no administration in time range)  folic acid (FOLVITE) tablet 1 mg (has no administration in time range)  albuterol (VENTOLIN HFA) 108 (90 Base) MCG/ACT inhaler 2 puff (has no administration in time range)  enoxaparin (LOVENOX) injection 40 mg (40 mg Subcutaneous Given 09/17/19 2018)  methylPREDNISolone sodium succinate (SOLU-MEDROL) 125 mg/2 mL injection 60 mg (60 mg Intravenous Given 09/17/19 2019)  ascorbic acid (VITAMIN C) tablet 500 mg (has no administration in time range)  zinc sulfate capsule 220 mg (has no administration in time range)  guaiFENesin-dextromethorphan  (ROBITUSSIN DM) 100-10 MG/5ML syrup 10 mL (has no administration in time range)  chlorpheniramine-HYDROcodone (TUSSIONEX) 10-8 MG/5ML suspension 5 mL (has no administration  in time range)  acetaminophen (TYLENOL) tablet 650 mg (has no administration in time range)  ondansetron (ZOFRAN) tablet 4 mg (has no administration in time range)    Or  ondansetron (ZOFRAN) injection 4 mg (has no administration in time range)  dexamethasone (DECADRON) injection 10 mg (10 mg Intravenous Given 09/17/19 1720)  remdesivir 100 mg in sodium chloride 0.9 % 100 mL IVPB (0 mg Intravenous Stopped 09/17/19 2003)  sodium chloride 0.45 % bolus 250 mL (0 mLs Intravenous Stopped 09/17/19 1858)  potassium chloride SA (KLOR-CON) CR tablet 40 mEq (40 mEq Oral Given 09/17/19 2018)  iohexol (OMNIPAQUE) 350 MG/ML injection 100 mL (65 mLs Intravenous Contrast Given 09/17/19 1959)    ED Course  I have reviewed the triage vital signs and the nursing notes.  Pertinent labs & imaging results that were available during my care of the patient were reviewed by me and considered in my medical decision making (see chart for details).  Clinical Course as of Sep 16 2225  Wed Sep 17, 2019  1454 pO2, Arterial(!): 63.3 [AH]    Clinical Course User Index [AH] Margarita Mail, PA-C   MDM Rules/Calculators/A&P                          CC:sob VS:  Vitals:   09/17/19 1850 09/17/19 2000 09/17/19 2019 09/17/19 2133  BP: 107/65 (!) 109/56  119/66  Pulse: 91 90 91 89  Resp: 19 (!) 25 14 18   Temp:   98.6 F (37 C) 98.9 F (37.2 C)  TempSrc:   Oral   SpO2: 95% 94% 97% 95%    ER:DEYCXKG is gathered by patient and EMR. Previous records obtained and reviewed. DDX:The patient's complaint of sob involves an extensive number of diagnostic and treatment options, and is a complaint that carries with it a high risk of complications, morbidity, and potential mortality. Given the large differential diagnosis, medical decision making is of high  complexity. The emergent differential diagnosis for shortness of breath includes, but is not limited to, Pulmonary edema, bronchoconstriction, Pneumonia, Pulmonary embolism, Pneumotherax/ Hemothorax, Dysrythmia, ACS.   Labs: I ordered reviewed and interpreted labs which include CBC without elevated white blood cell count , CMP with mildly elevated creatinine level, no other significant abnormalities.  Elevated lactate dehydrogenase, D-dimer, C-reactive protein, fibrinogen, ferritin suggestive of acute inflammatory process.  Lactic acid within normal limits and Covid test positive. Imaging: I ordered and reviewed images which included portable 1 view chest x-ray. I independently visualized and interpreted all imaging. Significant findings include multifocal pneumonia.  EKG: Normal sinus rhythm at a rate of 98 Consults: Dr. Curly Rim with [facility] hospitalist MDM: Patient here with acute hypoxic respiratory failure secondary to Covid pneumonia.  She admitted to the hospitalist service.  She is maintaining normal oxygen saturations on 4 L. Patient disposition: Admit The patient appears reasonably stabilized for admission considering the current resources, flow, and capabilities available in the ED at this time, and I doubt any other [facility] requiring further screening and/or treatment in the ED prior to admission.         LAYANA KONKEL was evaluated in Emergency Department on 09/17/2019 for the symptoms described in the history of present illness. She was evaluated in the context of the global COVID-19 pandemic, which necessitated consideration that the patient might be at risk for infection with the SARS-CoV-2 virus that causes COVID-19. Institutional protocols and algorithms that pertain to the evaluation of patients at risk  for COVID-19 are in a state of rapid change based on information released by regulatory bodies including the CDC and federal and state organizations. These policies and  algorithms were followed during the patient's care in the ED.  Final Clinical Impression(s) / ED Diagnoses Final diagnoses:  Acute hypoxemic respiratory failure due to COVID-19 [facility])    Rx / DC Orders ED Discharge Orders    None       Margarita Mail, PA-C 09/17/19 2228    Lajean Saver, MD 09/20/19 [phone]

## 2019-09-17 NOTE — ED Triage Notes (Signed)
Pt brought in by [facility] for c/o sob; pt states she has been sick x 3 weeks; she has not been tested for covid; pt has had non-productive cough; pt was 80% and altered initially with ems; they applied NRB and then a [place] and pt O2 sats in the 90-94%; pt states she has been feeling bad since all the humid weather

## 2019-09-17 NOTE — H&P (Addendum)
[facility] History and Physical  Kimberly Livingston EGB:[id number] DOB: 04/20/1950 DOA: 09/17/2019   PCP: Monico Blitz, MD  Specialists: None  Chief Complaint: Progressively worsening shortness of breath  HPI: Kimberly Livingston is a 69 y.o. female with a past medical history of interstitial lung disease, rheumatoid arthritis, acid reflux who presented to the emergency department with complaints of shortness of breath.  Her symptoms have been ongoing for a few days.  She initially started with nausea vomiting and diarrhea.  She is started developing a cough with yellowish expectoration.  Denies any chest pain but has had some tightness in the chest especially with coughing.  She has not been vaccinated against COVID-19 yet.  EMS was called.  She was noted to be hypoxic with saturations in the 80s.  She was placed on a nonrebreather and then brought to the emergency department.  In the ED she was found with positive for COVID-19.  She was weaned off of the nonrebreather and placed on 4 L of oxygen saturating in the early 90s.  Chest x-ray shows bilateral opacities consistent with pneumonia.  She will need hospitalization for further management.  Home Medications: Prior to Admission medications   Medication Sig Start Date End Date Taking? Authorizing Provider  albuterol (PROVENTIL HFA;VENTOLIN HFA) 108 (90 Base) MCG/ACT inhaler Inhale 2 puffs into the lungs every 6 (six) hours as needed for wheezing or shortness of breath. 07/06/17  Yes Mannam, Praveen, MD  albuterol (PROVENTIL) (2.5 MG/3ML) 0.083% nebulizer solution Take 2.5 mg by nebulization every 6 (six) hours as needed for wheezing or shortness of breath.   Yes [provider]  calcium carbonate (TUMS - DOSED IN MG ELEMENTAL CALCIUM) 500 MG chewable tablet Chew 1 tablet by mouth 3 (three) times daily as needed for indigestion or heartburn.    Yes [provider]  dextromethorphan-guaiFENesin (MUCINEX DM) 30-600 MG  12hr tablet Take 1 tablet by mouth 2 (two) times daily as needed (cough/congestion.).    Yes [provider]  famotidine (PEPCID) 20 MG tablet TAKE 1 TABLET BY MOUTH EVERYDAY AT BEDTIME Patient taking differently: Take 20 mg by mouth daily as needed (indigestion/heartburn.).  12/26/18  Yes Annitta Needs, NP  folic acid (FOLVITE) 1 MG tablet Take 1 mg by mouth 2 (two) times daily.    Yes [provider]  methotrexate (RHEUMATREX) 2.5 MG tablet Take 20 mg by mouth every Wednesday. 8 tablets   Yes [provider]  omeprazole (PRILOSEC) 40 MG capsule TAKE 1 CAPSULE BY MOUTH EVERY DAY Patient taking differently: Take 40 mg by mouth daily before breakfast.  12/26/18  Yes Annitta Needs, NP  pantoprazole (PROTONIX) 40 MG tablet Take 40 mg by mouth daily as needed for heartburn. 12/24/18  Yes [provider]  PARoxetine (PAXIL) 40 MG tablet Take 40 mg by mouth daily.    Yes [provider]  PAZEO 0.7 % SOLN Place 1-2 drops into both eyes 3 (three) times daily as needed (allergies.).  06/26/15  Yes [provider]  predniSONE (DELTASONE) 5 MG tablet Take 5 mg by mouth daily with breakfast.    Yes [provider]  traZODone (DESYREL) 50 MG tablet Take 50 mg by mouth daily. 07/01/15  Yes [provider]    Allergies:  Allergies  Allergen Reactions  . Penicillins Swelling    Did it involve swelling of the face/tongue/throat, SOB, or low BP? Yes Did it involve sudden or severe rash/hives, skin peeling, or any reaction  on the inside of your mouth or nose? No Did you need to seek medical attention at a hospital or doctor's office? Yes When did it last happen?~5 years ago If all above answers are "NO", may proceed with cephalosporin use.     Past Medical History: Past Medical History:  Diagnosis Date  . Allergic rhinitis   . Fatty liver   . GERD (gastroesophageal reflux disease)   . ILD (interstitial lung disease) ([place])   .  Rheumatoid arthritis [facility])     Past Surgical History:  Procedure Laterality Date  . BREAST SURGERY Bilateral    implants  . COLONOSCOPY  2014  . COLONOSCOPY WITH PROPOFOL N/A 02/11/2019   Procedure: COLONOSCOPY WITH PROPOFOL;  Surgeon: Danie Binder, MD;  Location: [facility];  Service: Endoscopy;  Laterality: N/A;  2:00pm  . FOOT SURGERY Bilateral    bunions  . POLYPECTOMY  02/11/2019   Procedure: POLYPECTOMY;  Surgeon: Danie Binder, MD;  Location: [facility];  Service: Endoscopy;;    Social History: Lives with her husband.  Denies smoking alcohol use illicit drug use.  Usually independent with daily activities.  Family History:  Family History  Problem Relation Age of Onset  . Stroke Mother   . Heart disease Father   . Allergic rhinitis Sister   . Allergic rhinitis Brother   . Heart attack Brother   . Colon cancer Neg Hx   . Colon polyps Neg Hx      Review of Systems - History obtained from the patient General ROS: positive for  - fatigue Psychological ROS: negative Ophthalmic ROS: negative ENT ROS: negative Allergy and Immunology ROS: negative Hematological and Lymphatic ROS: negative Endocrine ROS: negative Respiratory ROS: As in HPI Cardiovascular ROS: As in HPI Gastrointestinal ROS: As in HPI Genito-Urinary ROS: no dysuria, trouble voiding, or hematuria Musculoskeletal ROS: negative Neurological ROS: no TIA or stroke symptoms Dermatological ROS: negative  Physical Examination  Vitals:   09/17/19 1800 09/17/19 1820 09/17/19 1830 09/17/19 1850  BP: 114/65  118/66 107/65  Pulse: 91 93 94 91  Resp: (!) 21 18 (!) 25 19  Temp:  98.9 F (37.2 C)    TempSrc:  Oral    SpO2: 93% 97% 95% 95%    BP 107/65 (BP Location: Left Arm)   Pulse 91   Temp 98.9 F (37.2 C) (Oral)   Resp 19   SpO2 95%   General appearance: alert, cooperative, appears stated age, distracted and no distress Head: Normocephalic, without obvious abnormality, atraumatic Eyes:  conjunctivae/corneas clear. PERRL, EOM's intact.  Throat: Very dry oral mucous membranes.  No lesions noted. Neck: no adenopathy, no carotid bruit, no JVD, supple, symmetrical, trachea midline and thyroid not enlarged, symmetric, no tenderness/mass/nodules Resp: Noted to be tachypneic.  Crackles bilateral bases.  No wheezing or rhonchi.  No use of accessory muscles. Cardio: regular rate and rhythm, S1, S2 normal, no murmur, click, rub or gallop GI: soft, non-tender; bowel sounds normal; no masses,  no organomegaly Extremities: extremities normal, atraumatic, no cyanosis or edema Pulses: 2+ and symmetric Skin: Skin color, texture, turgor normal. No rashes or lesions Lymph nodes: Cervical, supraclavicular, and axillary nodes normal. Neurologic: Mildly distracted.  Alert and oriented to person place time.  No obvious focal neurological deficits.   Labs on Admission: I have personally reviewed following labs and imaging studies  CBC: Recent Labs  Lab 09/17/19 1410  WBC 8.9  NEUTROABS 6.6  HGB 15.4*  HCT 46.0  MCV 80.1  PLT 510   Basic Metabolic Panel: Recent Labs  Lab 09/17/19 1410  NA 135  K 3.4*  CL 102  CO2 19*  GLUCOSE 97  BUN 20  CREATININE 1.38*  CALCIUM 8.4*   GFR: CrCl cannot be calculated (Unknown ideal weight.). Liver Function Tests: Recent Labs  Lab 09/17/19 1410  AST 61*  ALT 29  ALKPHOS 62  BILITOT 1.2  PROT 7.9  ALBUMIN 3.2*   Lipid Profile: Recent Labs    09/17/19 1410  TRIG 103   Anemia Panel: Recent Labs    09/17/19 1410  FERRITIN 1,024*     Radiological Exams on Admission: DG Chest Port 1 View  Result Date: 09/17/2019 CLINICAL DATA:  Hypoxia, sickness for 3 weeks EXAM: PORTABLE CHEST 1 VIEW COMPARISON:  07/15/2015 FINDINGS: Single frontal view of the chest demonstrates a stable cardiac silhouette. There are multifocal bilateral areas of airspace disease, superimposed upon chronic interstitial scarring. No effusion or pneumothorax. No  acute bony abnormalities. IMPRESSION: 1. Multifocal bilateral airspace disease, favor atypical infection over edema. Pattern is consistent with COVID-19. Electronically Signed   By: Randa Ngo M.D.   On: 09/17/2019 15:02     My interpretation of Electrocardiogram: Sinus rhythm in the 90s.  Intervals are normal.  Poor R wave progression is noted.  No concerning ST or T wave changes.   Problem List  Principal Problem:   Pneumonia due to COVID-19 virus Active Problems:   ILD (interstitial lung disease) ([place])   Rheumatoid arthritis ([place])   Acute respiratory failure with hypoxia ([facility])   Hypokalemia   Assessment: This is a 69 year old Caucasian female with past medical history as stated earlier who comes in with shortness of breath.  She is found to have pneumonia due to COVID-19.  She has acute respiratory failure with hypoxia.  She will need hospitalization for further management.  She is found to have significantly elevated D-dimer so venous thromboembolism is a possibility.  Plan:  Acute Hypoxic Resp. Failure/Pneumonia due to COVID-19     Component Value Date/Time   PHART 7.416 09/17/2019 1415   PCO2ART 31.6 (L) 09/17/2019 1415   PO2ART 63.3 (L) 09/17/2019 1415   HCO3 21.8 09/17/2019 1415   ACIDBASEDEF 3.9 (H) 09/17/2019 1415   O2SAT 90.8 09/17/2019 1415    Recent Labs  Lab 09/17/19 1410  DDIMER 9.70*  FERRITIN 1,024*  CRP 14.2*  ALT 29  PROCALCITON 0.11    Objective findings: Fever: Noted to be afebrile Oxygen requirements: Currently on 4 L of oxygen saturating in the early to mid 90s.  COVID 19 Therapeutics: Antibacterials: Procalcitonin 0.11.  No need for antibacterial agents Remdesivir: Initiated today Steroids: Solu-Medrol Diuretics: None at this time Actemra/Baricitinib: Use of baricitinib was discussed with the patient.  Will use if her oxygen requirements climb.   PUD Prophylaxis: Pepcid DVT Prophylaxis:  Lovenox  From a respiratory standpoint patient  is on 4 L of oxygen saturating in the early 90s.  Her CRP is elevated.  D-dimer is noted to be significantly elevated at 9.7.  CT angiogram was discussed with the patient.  She is agreeable.  Will order.  Creatinine noted to be slightly high.  We will hydrate her.  Recheck labs tomorrow.  If CT angiogram is negative will also need to do lower extremity Doppler studies.   ADDENDUM: CT angiogram negative for PE.  Please see report for other nonacute findings.  She has been started on remdesivir and steroids.  Baricitinib and Actemra was discussed in detail  with the patient as well as her husband over the phone.  They are agreeable to take if patient's condition worsens.  At this time we will hold off. Monitor LFTs.  Patient also had GI symptoms with nausea vomiting and diarrhea.  Treat symptomatically.  Patient encouraged to stay in prone position as much as possible.  Incentive spirometer.  Mobilization.  The treatment plan and use of medications and known side effects were discussed with patient/family. Some of the medications used are based on case reports/anecdotal data.  All other medications being used in the management of COVID-19 based on limited study data.  Complete risks and long-term side effects are unknown, however in the best clinical judgment they seem to be of some benefit.  Patient/family wanted to proceed with treatment options provided.  Mild acute renal failure/dehydration/hypokalemia Likely due to hypovolemia from her GI symptoms.  Will be hydrated.  Recheck labs tomorrow.  Potassium will be repleted.  History of interstitial lung disease Does not use any oxygen at home.    History of rheumatoid arthritis Home medication list show methotrexate and prednisone.  Since she is getting Solu-Medrol we will hold the prednisone for now.  Methotrexate will also be placed on hold for now.  History of GERD Pepcid  DVT Prophylaxis: Lovenox Code Status: Full code Family  Communication: Discussed with the patient and her husband Disposition: Hopefully return home in improved Consults called: None Admission Status: Status is: Inpatient  Remains inpatient appropriate because:IV treatments appropriate due to intensity of illness or inability to take PO and Inpatient level of care appropriate due to severity of illness   Dispo: The patient is from: Home              Anticipated d/c is to: Home              Anticipated d/c date is: 3 days              Patient currently is not medically stable to d/c.   Severity of Illness: The appropriate patient status for this patient is INPATIENT. Inpatient status is judged to be reasonable and necessary in order to provide the required intensity of service to ensure the patient's safety. The patient's presenting symptoms, physical exam findings, and initial radiographic and laboratory data in the context of their chronic comorbidities is felt to place them at high risk for further clinical deterioration. Furthermore, it is not anticipated that the patient will be medically stable for discharge from the hospital within 2 midnights of admission. The following factors support the patient status of inpatient.   " The patient's presenting symptoms include shortness of breath. " The worrisome physical exam findings include hypoxia. " The initial radiographic and laboratory data are worrisome because of pneumonia. " The chronic co-morbidities include rheumatoid arthritis on immunosuppressants.   * I certify that at the point of admission it is my clinical judgment that the patient will require inpatient hospital care spanning beyond 2 midnights from the point of admission due to high intensity of service, high risk for further deterioration and high frequency of surveillance required.*  Further management decisions will depend on results of further testing and patient's response to treatment.   Kemya Shed [employer]  [facility]  [employer] operational officer on [email or web address]  09/17/2019, 8:02 PM

## 2019-09-17 NOTE — Plan of Care (Signed)

## 2019-09-18 ENCOUNTER — Inpatient Hospital Stay (HOSPITAL_COMMUNITY): Payer: Medicare HMO

## 2019-09-18 DIAGNOSIS — J849 Interstitial pulmonary disease, unspecified: Secondary | ICD-10-CM

## 2019-09-18 LAB — CBC WITH DIFFERENTIAL/PLATELET
Abs Immature Granulocytes: 0.03 10*3/uL (ref 0.00–0.07)
Basophils Absolute: 0 10*3/uL (ref 0.0–0.1)
Basophils Relative: 0 %
Eosinophils Absolute: 0 10*3/uL (ref 0.0–0.5)
Eosinophils Relative: 0 %
HCT: 45 % (ref 36.0–46.0)
Hemoglobin: 14.8 g/dL (ref 12.0–15.0)
Immature Granulocytes: 1 %
Lymphocytes Relative: 19 %
Lymphs Abs: 0.7 10*3/uL (ref 0.7–4.0)
MCH: 26.8 pg (ref 26.0–34.0)
MCHC: 32.9 g/dL (ref 30.0–36.0)
MCV: 81.4 fL (ref 80.0–100.0)
Monocytes Absolute: 0.2 10*3/uL (ref 0.1–1.0)
Monocytes Relative: 5 %
Neutro Abs: 2.7 10*3/uL (ref 1.7–7.7)
Neutrophils Relative %: 75 %
Platelets: 223 10*3/uL (ref 150–400)
RBC: 5.53 MIL/uL — ABNORMAL HIGH (ref 3.87–5.11)
RDW: 18.6 % — ABNORMAL HIGH (ref 11.5–15.5)
WBC: 3.7 10*3/uL — ABNORMAL LOW (ref 4.0–10.5)
nRBC: 0 % (ref 0.0–0.2)

## 2019-09-18 LAB — COMPREHENSIVE METABOLIC PANEL
ALT: 30 U/L (ref 0–44)
AST: 62 U/L — ABNORMAL HIGH (ref 15–41)
Albumin: 2.9 g/dL — ABNORMAL LOW (ref 3.5–5.0)
Alkaline Phosphatase: 58 U/L (ref 38–126)
Anion gap: 12 (ref 5–15)
BUN: 26 mg/dL — ABNORMAL HIGH (ref 8–23)
CO2: 18 mmol/L — ABNORMAL LOW (ref 22–32)
Calcium: 8.4 mg/dL — ABNORMAL LOW (ref 8.9–10.3)
Chloride: 106 mmol/L (ref 98–111)
Creatinine, Ser: 1.23 mg/dL — ABNORMAL HIGH (ref 0.44–1.00)
GFR calc Af Amer: 51 mL/min — ABNORMAL LOW (ref >60)
GFR calc non Af Amer: 44 mL/min — ABNORMAL LOW (ref >60)
Glucose, Bld: 118 mg/dL — ABNORMAL HIGH (ref 70–99)
Potassium: 4.1 mmol/L (ref 3.5–5.1)
Sodium: 136 mmol/L (ref 135–145)
Total Bilirubin: 0.7 mg/dL (ref 0.3–1.2)
Total Protein: 7.3 g/dL (ref 6.5–8.1)

## 2019-09-18 LAB — D-DIMER, QUANTITATIVE: D-Dimer, Quant: 9.16 ug/mL-FEU — ABNORMAL HIGH (ref 0.00–0.50)

## 2019-09-18 LAB — C-REACTIVE PROTEIN: CRP: 12.9 mg/dL — ABNORMAL HIGH (ref <1.0)

## 2019-09-18 LAB — HIV ANTIBODY (ROUTINE TESTING W REFLEX): HIV Screen 4th Generation wRfx: NONREACTIVE

## 2019-09-18 NOTE — Progress Notes (Signed)
PROGRESS NOTE  Kimberly Livingston IWP:[id number] DOB: [birth year] DOA: 09/17/2019 PCP: Monico Blitz, MD  Brief History:  69 year old female with a history of hepatic steatosis, interstitial lung disease, RA, GERD presenting with shortness of breath.  Patient initially had some nausea, vomiting, diarrhea.  This was subsequently followed by having coughing with yellow sputum.  The patient is not vaccinated for COVID-19.  Because of shortness of breath, EMS was activated.  The patient was noted to have oxygen saturation 80% on room air.  She was placed on nonrebreather initially, this was subsequently weaned to 2 L nasal cannula.  Chest x-ray showed bilateral opacities.  COVID-19 was positive.  CTA of the chest was negative for any pulmonary embolus but showed patchy groundglass opacities.  The patient was started on remdesivir and steroids.  Assessment/Plan: Acute respiratory failure with hypoxia secondary to COVID-19 pneumonia -Initially placed on 4 L -Currently stable on 2 L nasal cannula -Wean oxygen as tolerated -CRP 14.2>> 12.9 -Ferritin 1024 -D-dimer 9.70>>  9.16 -Continue remdesivir and steroids -Continue vitamin C and zinc -Procalcitonin 0.11 -Personally reviewed chest x-ray--scattered opacities  Dehydration -Patient received fluids for 24 hours  CKD stage IIIb -Baseline creatinine 1.2-1.4 -A.m. BMP  Hypokalemia -Repleted -Check magnesium  Interstitial lung disease -Likely related to the patient's rheumatoid arthritis -She is not on home oxygen  Rheumatoid arthritis -She is on methotrexate every Wednesday -Holding prednisone as the patient is on IV steroids  GERD -Continue PPI     Status is: Inpatient  Remains inpatient appropriate because:IV treatments appropriate due to intensity of illness or inability to take PO   Dispo: The patient is from: Home              Anticipated d/c is to: Home              Anticipated d/c date is: 2 days               Patient currently is not medically stable to d/c.        Family Communication:   Spouse updated 9/9  Consultants:  none  Code Status:  FULL  DVT Prophylaxis:  [place] Lovenox   Procedures: As Listed in Progress Note Above  Antibiotics: None       Subjective: Patient states that her breathing is better than yesterday but she remains short of breath.  She has a cough with yellow sputum.  She denies any vomiting, diarrhea, abdominal pain.  Objective: Vitals:   09/17/19 2346 09/18/19 0134 09/18/19 0527 09/18/19 0907  BP:  114/90 109/69 119/65  Pulse:  87 77 78  Resp:  17 18 18   Temp:  98 F (36.7 C) 97.7 F (36.5 C) 97.8 F (36.6 C)  TempSrc:    Oral  SpO2:  94% 96% 94%  Weight: 66.2 kg     Height: 5\' 1"  (1.549 m)       Intake/Output Summary (Last 24 hours) at 09/18/2019 1338 Last data filed at 09/18/2019 0300 Gross per 24 hour  Intake 597.62 ml  Output --  Net 597.62 ml   Weight change:  Exam:   General:  Pt is alert, follows commands appropriately, not in acute distress  HEENT: No icterus, No thrush, No neck mass, [place]/AT  Cardiovascular: RRR, S1/S2, no rubs, no gallops  Respiratory: Bibasilar rales but no wheezing.  Good air movement  Abdomen: Soft/+BS, non tender, non distended, no guarding  Extremities: No edema, No lymphangitis, No  petechiae, No rashes, no synovitis   Data Reviewed: I have personally reviewed following labs and imaging studies Basic Metabolic Panel: Recent Labs  Lab 09/17/19 1410 09/18/19 0706  NA 135 136  K 3.4* 4.1  CL 102 106  CO2 19* 18*  GLUCOSE 97 118*  BUN 20 26*  CREATININE 1.38* 1.23*  CALCIUM 8.4* 8.4*   Liver Function Tests: Recent Labs  Lab 09/17/19 1410 09/18/19 0706  AST 61* 62*  ALT 29 30  ALKPHOS 62 58  BILITOT 1.2 0.7  PROT 7.9 7.3  ALBUMIN 3.2* 2.9*   No results for input(s): LIPASE, AMYLASE in the last 168 hours. No results for input(s): AMMONIA in the last 168 hours. Coagulation  Profile: No results for input(s): INR, PROTIME in the last 168 hours. CBC: Recent Labs  Lab 09/17/19 1410 09/18/19 0706  WBC 8.9 3.7*  NEUTROABS 6.6 2.7  HGB 15.4* 14.8  HCT 46.0 45.0  MCV 80.1 81.4  PLT 234 223   Cardiac Enzymes: No results for input(s): CKTOTAL, CKMB, CKMBINDEX, TROPONINI in the last 168 hours. BNP: Invalid input(s): POCBNP CBG: No results for input(s): GLUCAP in the last 168 hours. HbA1C: No results for input(s): HGBA1C in the last 72 hours. Urine analysis: No results found for: COLORURINE, APPEARANCEUR, LABSPEC, PHURINE, GLUCOSEU, HGBUR, BILIRUBINUR, KETONESUR, PROTEINUR, UROBILINOGEN, NITRITE, LEUKOCYTESUR Sepsis Labs: @LABRCNTIP (procalcitonin:4,lacticidven:4) ) Recent Results (from the past 240 hour(s))  SARS Coronavirus 2 by RT PCR (hospital order, performed in [facility] hospital lab) Nasopharyngeal Nasopharyngeal Swab     Status: Abnormal   Collection Time: 09/17/19  1:31 PM   Specimen: Nasopharyngeal Swab  Result Value Ref Range Status   SARS Coronavirus 2 POSITIVE (A) NEGATIVE Final    Comment: RESULT CALLED TO, READ BACK BY AND VERIFIED WITH: WHITE,M ON 09/17/19 AT 1640 BY LOY,C (NOTE) SARS-CoV-2 target nucleic acids are DETECTED  SARS-CoV-2 RNA is generally detectable in upper respiratory specimens  during the acute phase of infection.  Positive results are indicative  of the presence of the identified virus, but do not rule out bacterial infection or co-infection with other pathogens not detected by the test.  Clinical correlation with patient history and  other diagnostic information is necessary to determine patient infection status.  The expected result is negative.  Fact Sheet for Patients:   StrictlyIdeas.no   Fact Sheet for Healthcare Providers:   BankingDealers.co.za    This test is not yet approved or cleared by the [place] FDA and  has been authorized for detection and/or  diagnosis of SARS-CoV-2 by FDA under an Emergency Use Authorization (EUA).  This EUA will remain in effect (meaning this te st can be used) for the duration of  the COVID-19 declaration under Section 564(b)(1) of the Act, 21 U.S.C. section 360-bbb-3(b)(1), unless the authorization is terminated or revoked sooner.  Performed at [facility], [address] [place], [place] [address]   Blood Culture (routine x 2)     Status: None (Preliminary result)   Collection Time: 09/17/19  2:09 PM   Specimen: Right Antecubital; Blood  Result Value Ref Range Status   Specimen Description   Final    RIGHT ANTECUBITAL BOTTLES DRAWN AEROBIC AND ANAEROBIC   Special Requests Blood Culture adequate volume  Final   Culture   Final    NO GROWTH < 24 HOURS Performed at [facility], [address], [place], [place] [address]    Report Status PENDING  Incomplete  Blood Culture (routine x 2)  Status: None (Preliminary result)   Collection Time: 09/17/19  2:11 PM   Specimen: Left Antecubital; Blood  Result Value Ref Range Status   Specimen Description   Final    LEFT ANTECUBITAL BOTTLES DRAWN AEROBIC AND ANAEROBIC   Special Requests Blood Culture adequate volume  Final   Culture   Final    NO GROWTH < 24 HOURS Performed at [facility], [address], [place], [place] [address]    Report Status PENDING  Incomplete     Scheduled Meds:  vitamin C  500 mg Oral QHS   enoxaparin (LOVENOX) injection  40 mg Subcutaneous J49F   folic acid  1 mg Oral BID   methylPREDNISolone (SOLU-MEDROL) injection  60 mg Intravenous Q12H   PARoxetine  40 mg Oral Daily   zinc sulfate  220 mg Oral QHS   Continuous Infusions:  sodium chloride 75 mL/hr at 09/18/19 0524   sodium chloride Stopped (09/17/19 2020)   remdesivir 100 mg in NS 100 mL 100 mg (09/18/19 1111)    Procedures/Studies: CT ANGIO CHEST PE W OR WO CONTRAST  Result Date: 09/17/2019 CLINICAL DATA:  Shortness of breath and nonproductive cough for  3 weeks. Positive COVID test today. EXAM: CT ANGIOGRAPHY CHEST WITH CONTRAST TECHNIQUE: Multidetector CT imaging of the chest was performed using the standard protocol during bolus administration of intravenous contrast. Multiplanar CT image reconstructions and MIPs were obtained to evaluate the vascular anatomy. CONTRAST:  47mL OMNIPAQUE IOHEXOL 350 MG/ML SOLN COMPARISON:  07/15/2015 FINDINGS: Cardiovascular: The heart is borderline enlarged. No pericardial effusion. Mild tortuosity and calcification of the thoracic aorta but no focal aneurysm or dissection. Three-vessel coronary artery calcifications are noted. The pulmonary arterial tree is well opacified. No filling defects to suggest pulmonary embolism. Mediastinum/Nodes: Mediastinal and hilar lymphadenopathy likely related to the patient's COVID pneumonia. The esophagus is grossly normal. Lungs/Pleura: Extensive patchy largely peripheral ground-glass infiltrates consistent with extensive COVID pneumonia. No pleural effusions. Upper Abdomen: No significant upper abdominal findings. There is diffuse and fairly marked fatty infiltration of the liver noted. Small hiatal hernia noted. Musculoskeletal: Bilateral breast prostheses are noted. No breast masses or supraclavicular adenopathy. The thyroid gland is grossly normal. Borderline left axillary lymph node at 9 mm. No significant bony findings. Review of the MIP images confirms the above findings. IMPRESSION: 1. No CT findings for pulmonary embolism. 2. Mild tortuosity and calcification of the thoracic aorta but no focal aneurysm or dissection. 3. Three-vessel coronary artery calcifications. 4. Extensive patchy, largely peripheral, ground-glass infiltrates consistent with extensive COVID pneumonia. 5. Mediastinal and hilar lymphadenopathy, likely related to the patient's COVID pneumonia. 6. Diffuse and fairly marked fatty infiltration of the liver. 7. Aortic atherosclerosis. Aortic Atherosclerosis (ICD10-I70.0).  Electronically Signed   By: Marijo Sanes M.D.   On: 09/17/2019 20:11   US Venous Img Lower Bilateral (DVT)  Result Date: 09/18/2019 CLINICAL DATA:  Swelling, elevated D-dimer EXAM: BILATERAL LOWER EXTREMITY VENOUS DOPPLER ULTRASOUND TECHNIQUE: Gray-scale sonography with compression, as well as color and duplex ultrasound, were performed to evaluate the deep venous system(s) from the level of the common femoral vein through the popliteal and proximal calf veins. COMPARISON:  None. FINDINGS: VENOUS Normal compressibility of the common femoral, superficial femoral, and popliteal veins, as well as the visualized calf veins. Visualized portions of profunda femoral vein and great saphenous vein unremarkable. No filling defects to suggest DVT on grayscale or color Doppler imaging. Doppler waveforms show normal direction of venous flow, normal respiratory phasicity and response to augmentation.  OTHER None. Limitations: none IMPRESSION: No femoropopliteal DVT nor evidence of DVT within the visualized calf veins. If clinical symptoms are inconsistent or if there are persistent or worsening symptoms, further imaging (possibly involving the iliac veins) may be warranted. Electronically Signed   By: Lucrezia Europe M.D.   On: 09/18/2019 12:48   DG Chest Port 1 View  Result Date: 09/17/2019 CLINICAL DATA:  Hypoxia, sickness for 3 weeks EXAM: PORTABLE CHEST 1 VIEW COMPARISON:  07/15/2015 FINDINGS: Single frontal view of the chest demonstrates a stable cardiac silhouette. There are multifocal bilateral areas of airspace disease, superimposed upon chronic interstitial scarring. No effusion or pneumothorax. No acute bony abnormalities. IMPRESSION: 1. Multifocal bilateral airspace disease, favor atypical infection over edema. Pattern is consistent with COVID-19. Electronically Signed   By: Randa Ngo M.D.   On: 09/17/2019 15:02    Orson Eva, DO  [facility]  If 7PM-7AM, please contact  night-coverage [email or web address] Password [email or web address] 09/18/2019, 1:38 PM   LOS: 1 day

## 2019-09-19 LAB — CBC WITH DIFFERENTIAL/PLATELET
Abs Immature Granulocytes: 0.04 10*3/uL (ref 0.00–0.07)
Basophils Absolute: 0 10*3/uL (ref 0.0–0.1)
Basophils Relative: 0 %
Eosinophils Absolute: 0 10*3/uL (ref 0.0–0.5)
Eosinophils Relative: 0 %
HCT: 45.5 % (ref 36.0–46.0)
Hemoglobin: 15 g/dL (ref 12.0–15.0)
Immature Granulocytes: 1 %
Lymphocytes Relative: 11 %
Lymphs Abs: 0.7 10*3/uL (ref 0.7–4.0)
MCH: 26.8 pg (ref 26.0–34.0)
MCHC: 33 g/dL (ref 30.0–36.0)
MCV: 81.3 fL (ref 80.0–100.0)
Monocytes Absolute: 0.2 10*3/uL (ref 0.1–1.0)
Monocytes Relative: 2 %
Neutro Abs: 5.9 10*3/uL (ref 1.7–7.7)
Neutrophils Relative %: 86 %
Platelets: 296 10*3/uL (ref 150–400)
RBC: 5.6 MIL/uL — ABNORMAL HIGH (ref 3.87–5.11)
RDW: 18.4 % — ABNORMAL HIGH (ref 11.5–15.5)
WBC: 6.9 10*3/uL (ref 4.0–10.5)
nRBC: 0 % (ref 0.0–0.2)

## 2019-09-19 LAB — COMPREHENSIVE METABOLIC PANEL
ALT: 32 U/L (ref 0–44)
AST: 62 U/L — ABNORMAL HIGH (ref 15–41)
Albumin: 2.9 g/dL — ABNORMAL LOW (ref 3.5–5.0)
Alkaline Phosphatase: 60 U/L (ref 38–126)
Anion gap: 10 (ref 5–15)
BUN: 29 mg/dL — ABNORMAL HIGH (ref 8–23)
CO2: 18 mmol/L — ABNORMAL LOW (ref 22–32)
Calcium: 8.3 mg/dL — ABNORMAL LOW (ref 8.9–10.3)
Chloride: 111 mmol/L (ref 98–111)
Creatinine, Ser: 0.96 mg/dL (ref 0.44–1.00)
GFR calc Af Amer: >60 mL/min (ref >60)
GFR calc non Af Amer: 60 mL/min — ABNORMAL LOW (ref >60)
Glucose, Bld: 138 mg/dL — ABNORMAL HIGH (ref 70–99)
Potassium: 3.7 mmol/L (ref 3.5–5.1)
Sodium: 139 mmol/L (ref 135–145)
Total Bilirubin: 0.5 mg/dL (ref 0.3–1.2)
Total Protein: 7 g/dL (ref 6.5–8.1)

## 2019-09-19 LAB — C-REACTIVE PROTEIN: CRP: 3.7 mg/dL — ABNORMAL HIGH (ref <1.0)

## 2019-09-19 LAB — D-DIMER, QUANTITATIVE: D-Dimer, Quant: 8.04 ug/mL-FEU — ABNORMAL HIGH (ref 0.00–0.50)

## 2019-09-19 LAB — FERRITIN: Ferritin: 1265 ng/mL — ABNORMAL HIGH (ref 11–307)

## 2019-09-19 LAB — MAGNESIUM: Magnesium: 2.4 mg/dL (ref 1.7–2.4)

## 2019-09-19 MED ORDER — PREDNISONE 20 MG PO TABS: 60.0000 mg | ORAL_TABLET | Freq: Two times a day (BID) | ORAL | Status: DC

## 2019-09-19 MED ORDER — ASCORBIC ACID 500 MG PO TABS: 500.0000 mg | ORAL_TABLET | Freq: Every day | ORAL | Status: DC

## 2019-09-19 MED ORDER — ZINC SULFATE 220 (50 ZN) MG PO CAPS: 220.0000 mg | ORAL_CAPSULE | Freq: Every day | ORAL | Status: DC

## 2019-09-19 MED ORDER — PREDNISONE 20 MG PO TABS: 60.0000 mg | ORAL_TABLET | Freq: Two times a day (BID) | ORAL | 0 refills | Status: DC

## 2019-09-19 NOTE — Evaluation (Signed)
Physical Therapy Evaluation Patient Details Name: Kimberly Livingston MRN: [id number] DOB: 12/27/1950 Today's Date: 09/19/2019   History of Present Illness  Kimberly Livingston is a 69 y.o. female with a past medical history of interstitial lung disease, rheumatoid arthritis, acid reflux who presented to the emergency department with complaints of shortness of breath.  Her symptoms have been ongoing for a few days.  She initially started with nausea vomiting and diarrhea.  She is started developing a cough with yellowish expectoration.  Denies any chest pain but has had some tightness in the chest especially with coughing.  She has not been vaccinated against COVID-19 yet.  EMS was called.  She was noted to be hypoxic with saturations in the 80s.  She was placed on a nonrebreather and then brought to the emergency department.    Clinical Impression  Patient functioning near baseline for functional mobility and gait but does show impaired balance with standing/ambulating. Patient requires min assist to pull to seated EOB. She demonstrates good sitting balance and tolerance while seated EOB on room air. She transfers to standing with RW and is unsteady upon initial standing. She ambulates with RW with unsteady gait without loss of balance or fall and O2 sat decreases to 81 on room air without symptoms. Patient educated on using RW upon discharge in order to increase BOS and decrease the risk of falls. Patient discharged to care of nursing for ambulation daily as tolerated for length of stay.      Follow Up Recommendations No PT follow up    Equipment Recommendations  None recommended by PT    Recommendations for Other Services       Precautions / Restrictions Precautions Precautions: Fall Restrictions Weight Bearing Restrictions: No      Mobility  Bed Mobility Overal bed mobility: Needs Assistance Bed Mobility: Supine to Sit;Sit to Supine     Supine to sit: Min assist Sit to supine:  Independent   General bed mobility comments: pull to seated EOB  Transfers Overall transfer level: Needs assistance Equipment used: Rolling walker (2 wheeled) Transfers: Sit to/from [facility] Sit to Stand: Min guard Stand pivot transfers: Min guard       General transfer comment: transfer to standing using RW, some unsteadiness upon inital standing  Ambulation/Gait Ambulation/Gait assistance: Min guard Gait Distance (Feet): 20 Feet Assistive device: Rolling walker (2 wheeled) Gait Pattern/deviations: Step-through pattern;Decreased step length - right;Decreased step length - left Gait velocity: decreased   General Gait Details: slow, labored cadence, unsteady with RW O2 sat decrease to 81% while ambulating on room air - monitored throughout session by RN as well  Stairs            Wheelchair Mobility    Modified Rankin (Stroke Patients Only)       Balance Overall balance assessment: Needs assistance Sitting-balance support: Feet supported Sitting balance-Leahy Scale: Good Sitting balance - Comments: seated EOB     Standing balance-Leahy Scale: Fair Standing balance comment: with RW                             Pertinent Vitals/Pain Pain Assessment: No/denies pain    Home Living Family/patient expects to be discharged to:: Private residence Living Arrangements: Spouse/significant other Available Help at Discharge: Family Type of Home: House Home Access: Level entry       Home Equipment: Walker - 2 wheels      Prior Function Level of  Independence: Independent         Comments: Patient states independent with ADL, does not use AD     Hand Dominance        Extremity/Trunk Assessment   Upper Extremity Assessment Upper Extremity Assessment: Generalized weakness    Lower Extremity Assessment Lower Extremity Assessment: Generalized weakness    Cervical / Trunk Assessment Cervical / Trunk Assessment: Normal   Communication   Communication: No difficulties  Cognition Arousal/Alertness: Awake/alert Behavior During Therapy: WFL for tasks assessed/performed Overall Cognitive Status: Within Functional Limits for tasks assessed                                        General Comments      Exercises     Assessment/Plan    PT Assessment Patent does not need any further PT services  PT Problem List         PT Treatment Interventions      PT Goals (Current goals can be found in the Care Plan section)  Acute Rehab PT Goals Patient Stated Goal: Return home PT Goal Formulation: With patient Time For Goal Achievement: 09/19/19 Potential to Achieve Goals: Good    Frequency     Barriers to discharge        Co-evaluation               AM-PAC PT "6 Clicks" Mobility  Outcome Measure Help needed turning from your back to your side while in a flat bed without using bedrails?: None Help needed moving from lying on your back to sitting on the side of a flat bed without using bedrails?: A Little Help needed moving to and from a bed to a chair (including a wheelchair)?: A Little Help needed standing up from a chair using your arms (e.g., wheelchair or bedside chair)?: A Little Help needed to walk in hospital room?: A Little Help needed climbing 3-5 steps with a railing? : A Little 6 Click Score: 19    End of Session Equipment Utilized During Treatment: Gait belt;Oxygen Activity Tolerance: Patient tolerated treatment well Patient left: in bed;with nursing/sitter in room;with call bell/phone within reach Nurse Communication: Mobility status PT Visit Diagnosis: Unsteadiness on feet (R26.81);Other abnormalities of gait and mobility (R26.89);Muscle weakness (generalized) (M62.81)    Time: 9147-8295 PT Time Calculation (min) (ACUTE ONLY): 20 min   Charges:   PT Evaluation $PT Eval Moderate Complexity: 1 Mod PT Treatments $Therapeutic Activity: 8-22 mins         11:43 AM, 09/19/19 Mearl Latin PT, DPT Physical Therapist at [facility]

## 2019-09-19 NOTE — TOC Transition Note (Signed)
Transition of Care [facility]) - CM/SW Discharge Note   Patient Details  Name: Kimberly Livingston MRN: [id number] Date of Birth: July 04, 1950  [facility] CM/SW Contact:  Salome Arnt, LCSW Phone Number: 09/19/2019, 12:13 PM   Clinical Narrative:  Pt admitted for pneumonia due to COVID-19.  Pt will d/c home today. She has walker and 3N1 at home. Pt will require O2 and is agreeable to referral to [facility]. Referral made to [facility] and oxygen has been delivered. Remdesivir infusion arranged for Saturday and Sunday at [place] at [facility]. LCSW printed off instructions for RN to provide to pt. LCSW discussed home health PT and pt is agreeable to referral to [facility]. Linda notified and [facility] will call to set up visit. [facility], [facility], and Remdesivir appointments are on AVS. Pt's husband also updated and will provide transportation to [employer].    Final next level of care: [place] Barriers to Discharge: Barriers Resolved   Patient Goals and CMS Choice Patient states their goals for this hospitalization and ongoing recovery are:: return home      Discharge Placement                  Name of family member notified: Louie Casa- husband Patient and family notified of of transfer: 09/19/19  Discharge Plan and Services                DME Arranged: Oxygen DME Agency: [facility] Date DME Agency Contacted: 09/19/19 Time DME Agency Contacted: 0962 Representative spoke with at DME Agency: [place]: PT [place] Agency: [place] ([place]) Date [place]: 09/19/19 Time [place]: [id number] Representative spoke with at [place]: [place] ([place]) Interventions     Readmission Risk Interventions No flowsheet data found.

## 2019-09-19 NOTE — Discharge Summary (Signed)
Physician Discharge Summary  Kimberly Livingston:[id number] DOB: 02/08/1950 DOA: 09/17/2019  PCP: Monico Blitz, MD  Admit date: 09/17/2019 Discharge date: 09/19/2019  Admitted From: home Disposition:  Home   Recommendations for Outpatient Follow-up:  1. Follow up with PCP in 1-2 weeks 2. Please obtain BMP/CBC in one week   Home Health: yes Equipment/Devices: 3L [place]  Discharge Condition: Stable CODE STATUS: FULL Diet recommendation: Heart Healthy   Brief/Interim Summary: 69 year old female with a history of hepatic steatosis, interstitial lung disease, RA, GERD presenting with shortness of breath.  Patient initially had some nausea, vomiting, diarrhea.  This was subsequently followed by having coughing with yellow sputum.  The patient is not vaccinated for COVID-19.  Because of shortness of breath, EMS was activated.  The patient was noted to have oxygen saturation 80% on room air.  She was placed on nonrebreather initially, this was subsequently weaned to 2 L nasal cannula.  Chest x-ray showed bilateral opacities.  COVID-19 was positive.  CTA of the chest was negative for any pulmonary embolus but showed patchy groundglass opacities.  The patient was started on remdesivir and steroids.  Discharge Diagnoses:  Acute respiratory failure with hypoxia secondary to COVID-19 pneumonia -Initially placed on 4 L -Currently stable on 2 L nasal cannula -Wean oxygen as tolerated -CRP 14.2>> 12.9>>14.2 -Ferritin 1024>>1265 -D-dimer 9.70>>  9.16>>8.04 -Continue remdesivir and steroids -Continue vitamin C and zinc -Procalcitonin 0.11 -Personally reviewed chest x-ray--scattered opacities -ambulatory saturation <88% on RA -d/c home with 2 L [place] -outpatient remdesivir outpt infusions set up for 9/11 and 9/12 at 11AM -d/c home with 7 more days prednisone  Dehydration -Patient received fluids for 24 hours -improved  CKD stage IIIb -Baseline creatinine 1.2-1.4 -A.m.  BMP  Hypokalemia -Repleted -Check magnesium 2.4  Interstitial lung disease -Likely related to the patient's rheumatoid arthritis -She is not on home oxygen  Rheumatoid arthritis -She is on methotrexate every Wednesday -Holding prednisone as the patient is on IV steroids  GERD -Continue PPI  Discharge Instructions   Allergies as of 09/19/2019      Reactions   Penicillins Swelling   Did it involve swelling of the face/tongue/throat, SOB, or low BP? Yes Did it involve sudden or severe rash/hives, skin peeling, or any reaction on the inside of your mouth or nose? No Did you need to seek medical attention at a hospital or doctor's office? Yes When did it last happen?~5 years ago If all above answers are "NO", may proceed with cephalosporin use.      Medication List    TAKE these medications   albuterol (2.5 MG/3ML) 0.083% nebulizer solution Commonly known as: PROVENTIL Take 2.5 mg by nebulization every 6 (six) hours as needed for wheezing or shortness of breath.   albuterol 108 (90 Base) MCG/ACT inhaler Commonly known as: VENTOLIN HFA Inhale 2 puffs into the lungs every 6 (six) hours as needed for wheezing or shortness of breath.   ascorbic acid 500 MG tablet Commonly known as: VITAMIN C Take 1 tablet (500 mg total) by mouth at bedtime.   calcium carbonate 500 MG chewable tablet Commonly known as: TUMS - dosed in mg elemental calcium Chew 1 tablet by mouth 3 (three) times daily as needed for indigestion or heartburn.   dextromethorphan-guaiFENesin 30-600 MG 12hr tablet Commonly known as: MUCINEX DM Take 1 tablet by mouth 2 (two) times daily as needed (cough/congestion.).   famotidine 20 MG tablet Commonly known as: PEPCID TAKE 1 TABLET BY MOUTH EVERYDAY AT BEDTIME What changed:  See the new instructions.   folic acid 1 MG tablet Commonly known as: FOLVITE Take 1 mg by mouth 2 (two) times daily.   methotrexate 2.5 MG tablet Commonly known as:  RHEUMATREX Take 20 mg by mouth every Wednesday. 8 tablets   omeprazole 40 MG capsule Commonly known as: PRILOSEC TAKE 1 CAPSULE BY MOUTH EVERY DAY What changed:   how much to take  when to take this   pantoprazole 40 MG tablet Commonly known as: PROTONIX Take 40 mg by mouth daily as needed for heartburn.   PARoxetine 40 MG tablet Commonly known as: PAXIL Take 40 mg by mouth daily.   [place] 0.7 % Soln Generic drug: Olopatadine HCl Place 1-2 drops into both eyes 3 (three) times daily as needed (allergies.).   predniSONE 5 MG tablet Commonly known as: DELTASONE Take 5 mg by mouth daily with breakfast. What changed: Another medication with the same name was added. Make sure you understand how and when to take each.   predniSONE 20 MG tablet Commonly known as: DELTASONE Take 3 tablets (60 mg total) by mouth 2 (two) times daily with a meal. What changed: You were already taking a medication with the same name, and this prescription was added. Make sure you understand how and when to take each.   traZODone 50 MG tablet Commonly known as: DESYREL Take 50 mg by mouth daily.   zinc sulfate 220 (50 Zn) MG capsule Take 1 capsule (220 mg total) by mouth at bedtime.            Durable Medical Equipment  (From admission, onward)         Start     Ordered   09/19/19 0945  For home use only DME oxygen  Once       Question Answer Comment  Length of Need 6 Months   Mode or (Route) Nasal cannula   Liters per Minute 2   Frequency Continuous (stationary and portable oxygen unit needed)   Oxygen conserving device Yes   Oxygen delivery system Gas      09/19/19 0944          Allergies  Allergen Reactions  . Penicillins Swelling    Did it involve swelling of the face/tongue/throat, SOB, or low BP? Yes Did it involve sudden or severe rash/hives, skin peeling, or any reaction on the inside of your mouth or nose? No Did you need to seek medical attention at a hospital or  doctor's office? Yes When did it last happen?~5 years ago If all above answers are "NO", may proceed with cephalosporin use.     Consultations:  none   Procedures/Studies: CT ANGIO CHEST PE W OR WO CONTRAST  Result Date: 09/17/2019 CLINICAL DATA:  Shortness of breath and nonproductive cough for 3 weeks. Positive COVID test today. EXAM: CT ANGIOGRAPHY CHEST WITH CONTRAST TECHNIQUE: Multidetector CT imaging of the chest was performed using the standard protocol during bolus administration of intravenous contrast. Multiplanar CT image reconstructions and MIPs were obtained to evaluate the vascular anatomy. CONTRAST:  14mL OMNIPAQUE IOHEXOL 350 MG/ML SOLN COMPARISON:  07/15/2015 FINDINGS: Cardiovascular: The heart is borderline enlarged. No pericardial effusion. Mild tortuosity and calcification of the thoracic aorta but no focal aneurysm or dissection. Three-vessel coronary artery calcifications are noted. The pulmonary arterial tree is well opacified. No filling defects to suggest pulmonary embolism. Mediastinum/Nodes: Mediastinal and hilar lymphadenopathy likely related to the patient's COVID pneumonia. The esophagus is grossly normal. Lungs/Pleura: Extensive patchy largely peripheral ground-glass  infiltrates consistent with extensive COVID pneumonia. No pleural effusions. Upper Abdomen: No significant upper abdominal findings. There is diffuse and fairly marked fatty infiltration of the liver noted. Small hiatal hernia noted. Musculoskeletal: Bilateral breast prostheses are noted. No breast masses or supraclavicular adenopathy. The thyroid gland is grossly normal. Borderline left axillary lymph node at 9 mm. No significant bony findings. Review of the MIP images confirms the above findings. IMPRESSION: 1. No CT findings for pulmonary embolism. 2. Mild tortuosity and calcification of the thoracic aorta but no focal aneurysm or dissection. 3. Three-vessel coronary artery calcifications. 4. Extensive  patchy, largely peripheral, ground-glass infiltrates consistent with extensive COVID pneumonia. 5. Mediastinal and hilar lymphadenopathy, likely related to the patient's COVID pneumonia. 6. Diffuse and fairly marked fatty infiltration of the liver. 7. Aortic atherosclerosis. Aortic Atherosclerosis (ICD10-I70.0). Electronically Signed   By: Marijo Sanes M.D.   On: 09/17/2019 20:11   US Venous Img Lower Bilateral (DVT)  Result Date: 09/18/2019 CLINICAL DATA:  Swelling, elevated D-dimer EXAM: BILATERAL LOWER EXTREMITY VENOUS DOPPLER ULTRASOUND TECHNIQUE: Gray-scale sonography with compression, as well as color and duplex ultrasound, were performed to evaluate the deep venous system(s) from the level of the common femoral vein through the popliteal and proximal calf veins. COMPARISON:  None. FINDINGS: VENOUS Normal compressibility of the common femoral, superficial femoral, and popliteal veins, as well as the visualized calf veins. Visualized portions of profunda femoral vein and great saphenous vein unremarkable. No filling defects to suggest DVT on grayscale or color Doppler imaging. Doppler waveforms show normal direction of venous flow, normal respiratory phasicity and response to augmentation. OTHER None. Limitations: none IMPRESSION: No femoropopliteal DVT nor evidence of DVT within the visualized calf veins. If clinical symptoms are inconsistent or if there are persistent or worsening symptoms, further imaging (possibly involving the iliac veins) may be warranted. Electronically Signed   By: Lucrezia Europe M.D.   On: 09/18/2019 12:48   DG Chest Port 1 View  Result Date: 09/17/2019 CLINICAL DATA:  Hypoxia, sickness for 3 weeks EXAM: PORTABLE CHEST 1 VIEW COMPARISON:  07/15/2015 FINDINGS: Single frontal view of the chest demonstrates a stable cardiac silhouette. There are multifocal bilateral areas of airspace disease, superimposed upon chronic interstitial scarring. No effusion or pneumothorax. No acute bony  abnormalities. IMPRESSION: 1. Multifocal bilateral airspace disease, favor atypical infection over edema. Pattern is consistent with COVID-19. Electronically Signed   By: Randa Ngo M.D.   On: 09/17/2019 15:02        Discharge Exam: Vitals:   09/19/19 0618 09/19/19 0900  BP: (!) 121/59 (!) 113/58  Pulse: 79 93  Resp: 19 20  Temp:    SpO2: 95% 91%   Vitals:   09/18/19 1850 09/18/19 2014 09/19/19 0618 09/19/19 0900  BP:  120/68 (!) 121/59 (!) 113/58  Pulse:  82 79 93  Resp:  17 19 20   Temp:  97.9 F (36.6 C)    TempSrc:  Oral    SpO2: 94% 93% 95% 91%  Weight:      Height:        General: Pt is alert, awake, not in acute distress Cardiovascular: RRR, S1/S2 +, no rubs, no gallops Respiratory: bibasilar rales. No wheeze Abdominal: Soft, NT, ND, bowel sounds + Extremities: no edema, no cyanosis   The results of significant diagnostics from this hospitalization (including imaging, microbiology, ancillary and laboratory) are listed below for reference.    Significant Diagnostic Studies: CT ANGIO CHEST PE W OR WO CONTRAST  Result Date: 09/17/2019  CLINICAL DATA:  Shortness of breath and nonproductive cough for 3 weeks. Positive COVID test today. EXAM: CT ANGIOGRAPHY CHEST WITH CONTRAST TECHNIQUE: Multidetector CT imaging of the chest was performed using the standard protocol during bolus administration of intravenous contrast. Multiplanar CT image reconstructions and MIPs were obtained to evaluate the vascular anatomy. CONTRAST:  45mL OMNIPAQUE IOHEXOL 350 MG/ML SOLN COMPARISON:  07/15/2015 FINDINGS: Cardiovascular: The heart is borderline enlarged. No pericardial effusion. Mild tortuosity and calcification of the thoracic aorta but no focal aneurysm or dissection. Three-vessel coronary artery calcifications are noted. The pulmonary arterial tree is well opacified. No filling defects to suggest pulmonary embolism. Mediastinum/Nodes: Mediastinal and hilar lymphadenopathy likely  related to the patient's COVID pneumonia. The esophagus is grossly normal. Lungs/Pleura: Extensive patchy largely peripheral ground-glass infiltrates consistent with extensive COVID pneumonia. No pleural effusions. Upper Abdomen: No significant upper abdominal findings. There is diffuse and fairly marked fatty infiltration of the liver noted. Small hiatal hernia noted. Musculoskeletal: Bilateral breast prostheses are noted. No breast masses or supraclavicular adenopathy. The thyroid gland is grossly normal. Borderline left axillary lymph node at 9 mm. No significant bony findings. Review of the MIP images confirms the above findings. IMPRESSION: 1. No CT findings for pulmonary embolism. 2. Mild tortuosity and calcification of the thoracic aorta but no focal aneurysm or dissection. 3. Three-vessel coronary artery calcifications. 4. Extensive patchy, largely peripheral, ground-glass infiltrates consistent with extensive COVID pneumonia. 5. Mediastinal and hilar lymphadenopathy, likely related to the patient's COVID pneumonia. 6. Diffuse and fairly marked fatty infiltration of the liver. 7. Aortic atherosclerosis. Aortic Atherosclerosis (ICD10-I70.0). Electronically Signed   By: Marijo Sanes M.D.   On: 09/17/2019 20:11   US Venous Img Lower Bilateral (DVT)  Result Date: 09/18/2019 CLINICAL DATA:  Swelling, elevated D-dimer EXAM: BILATERAL LOWER EXTREMITY VENOUS DOPPLER ULTRASOUND TECHNIQUE: Gray-scale sonography with compression, as well as color and duplex ultrasound, were performed to evaluate the deep venous system(s) from the level of the common femoral vein through the popliteal and proximal calf veins. COMPARISON:  None. FINDINGS: VENOUS Normal compressibility of the common femoral, superficial femoral, and popliteal veins, as well as the visualized calf veins. Visualized portions of profunda femoral vein and great saphenous vein unremarkable. No filling defects to suggest DVT on grayscale or color Doppler  imaging. Doppler waveforms show normal direction of venous flow, normal respiratory phasicity and response to augmentation. OTHER None. Limitations: none IMPRESSION: No femoropopliteal DVT nor evidence of DVT within the visualized calf veins. If clinical symptoms are inconsistent or if there are persistent or worsening symptoms, further imaging (possibly involving the iliac veins) may be warranted. Electronically Signed   By: Lucrezia Europe M.D.   On: 09/18/2019 12:48   DG Chest Port 1 View  Result Date: 09/17/2019 CLINICAL DATA:  Hypoxia, sickness for 3 weeks EXAM: PORTABLE CHEST 1 VIEW COMPARISON:  07/15/2015 FINDINGS: Single frontal view of the chest demonstrates a stable cardiac silhouette. There are multifocal bilateral areas of airspace disease, superimposed upon chronic interstitial scarring. No effusion or pneumothorax. No acute bony abnormalities. IMPRESSION: 1. Multifocal bilateral airspace disease, favor atypical infection over edema. Pattern is consistent with COVID-19. Electronically Signed   By: Randa Ngo M.D.   On: 09/17/2019 15:02     Microbiology: Recent Results (from the past 240 hour(s))  SARS Coronavirus 2 by RT PCR (hospital order, performed in [facility] hospital lab) Nasopharyngeal Nasopharyngeal Swab     Status: Abnormal   Collection Time: 09/17/19  1:31 PM   Specimen: Nasopharyngeal  Swab  Result Value Ref Range Status   SARS Coronavirus 2 POSITIVE (A) NEGATIVE Final    Comment: RESULT CALLED TO, READ BACK BY AND VERIFIED WITH: WHITE,M ON 09/17/19 AT 1640 BY LOY,C (NOTE) SARS-CoV-2 target nucleic acids are DETECTED  SARS-CoV-2 RNA is generally detectable in upper respiratory specimens  during the acute phase of infection.  Positive results are indicative  of the presence of the identified virus, but do not rule out bacterial infection or co-infection with other pathogens not detected by the test.  Clinical correlation with patient history and  other diagnostic  information is necessary to determine patient infection status.  The expected result is negative.  Fact Sheet for Patients:   StrictlyIdeas.no   Fact Sheet for Healthcare Providers:   BankingDealers.co.za    This test is not yet approved or cleared by the [place] FDA and  has been authorized for detection and/or diagnosis of SARS-CoV-2 by FDA under an Emergency Use Authorization (EUA).  This EUA will remain in effect (meaning this te st can be used) for the duration of  the COVID-19 declaration under Section 564(b)(1) of the Act, 21 U.S.C. section 360-bbb-3(b)(1), unless the authorization is terminated or revoked sooner.  Performed at [facility], [address], [place], [place] [address]   Blood Culture (routine x 2)     Status: None (Preliminary result)   Collection Time: 09/17/19  2:09 PM   Specimen: Right Antecubital; Blood  Result Value Ref Range Status   Specimen Description   Final    RIGHT ANTECUBITAL BOTTLES DRAWN AEROBIC AND ANAEROBIC   Special Requests Blood Culture adequate volume  Final   Culture   Final    NO GROWTH 2 DAYS Performed at [facility], [address] [place], [place] [address]    Report Status PENDING  Incomplete  Blood Culture (routine x 2)     Status: None (Preliminary result)   Collection Time: 09/17/19  2:11 PM   Specimen: Left Antecubital; Blood  Result Value Ref Range Status   Specimen Description   Final    LEFT ANTECUBITAL BOTTLES DRAWN AEROBIC AND ANAEROBIC   Special Requests Blood Culture adequate volume  Final   Culture   Final    NO GROWTH 2 DAYS Performed at [facility], [address] [place], [place] [address]    Report Status PENDING  Incomplete     Labs: Basic Metabolic Panel: Recent Labs  Lab 09/17/19 1410 09/17/19 1410 09/18/19 0706 09/19/19 0755  NA 135  --  136 139  K 3.4*   < > 4.1 3.7  CL 102  --  106 111  CO2 19*  --  18* 18*  GLUCOSE 97  --  118* 138*   BUN 20  --  26* 29*  CREATININE 1.38*  --  1.23* 0.96  CALCIUM 8.4*  --  8.4* 8.3*  MG  --   --   --  2.4   < > = values in this interval not displayed.   Liver Function Tests: Recent Labs  Lab 09/17/19 1410 09/18/19 0706 09/19/19 0755  AST 61* 62* 62*  ALT 29 30 32  ALKPHOS 62 58 60  BILITOT 1.2 0.7 0.5  PROT 7.9 7.3 7.0  ALBUMIN 3.2* 2.9* 2.9*   No results for input(s): LIPASE, AMYLASE in the last 168 hours. No results for input(s): AMMONIA in the last 168 hours. CBC: Recent Labs  Lab 09/17/19 1410 09/18/19 0706 09/19/19 0755  WBC 8.9 3.7* 6.9  NEUTROABS 6.6 2.7 5.9  HGB 15.4* 14.8 15.0  HCT 46.0 45.0 45.5  MCV 80.1 81.4 81.3  PLT 234 223 296   Cardiac Enzymes: No results for input(s): CKTOTAL, CKMB, CKMBINDEX, TROPONINI in the last 168 hours. BNP: Invalid input(s): POCBNP CBG: No results for input(s): GLUCAP in the last 168 hours.  Time coordinating discharge:  36 minutes  Signed:  Orson Eva, DO [facility] Pager: [phone] 09/19/2019, 11:35 AM

## 2019-09-19 NOTE — Progress Notes (Signed)
Ambulated in room with PT and FWW. Pt's SaO2 on RA with ambulation fell to 81% HR up to 118 bpm. Pt denied SOB, no increased WOB. Pt back to bed, O2 2lpm [place] on and SaO2 back up to 95% and heart rate down to 90 bpm.

## 2019-09-19 NOTE — Progress Notes (Signed)
SATURATION QUALIFICATIONS: (This note is used to comply with regulatory documentation for home oxygen)  Patient Saturations on Room Air at Rest = 94  Patient Saturations on Room Air while Ambulating = 87  Patient Saturations on 2 Liters of oxygen while Ambulating = 94  Please briefly explain why patient needs home oxygen: To maintain 02 sat at 90% or above during ambulation.   Shanon Brow Robynne Roat,DO

## 2019-09-19 NOTE — Progress Notes (Signed)
Pt discharged via WC to POV with home O2 @ 2lpm [place] on. All pt belongings in her possession at time of discharge. Discharge instructions and use/instructions on Home 02 tank and concentrator given to both patient and her husband. Both stated understanding and husband able to give return demonstration on use of O2 equipment.

## 2019-09-19 NOTE — Care Management Important Message (Signed)
Important Message  Patient Details  Name: Kimberly Livingston MRN: [id number] Date of Birth: [birth year]   Medicare Important Message Given:  Yes - Important Message mailed due to current National Emergency     Tommy Medal 09/19/2019, 3:21 PM

## 2019-09-19 NOTE — Plan of Care (Signed)

## 2019-09-19 NOTE — Progress Notes (Signed)
Patient scheduled for outpatient Remdesivir infusions at 11am on Saturday 9/11 and Sunday 9/12 at [facility]. Please inform the patient to park at [address], [place], as staff will be escorting the patient through the east entrance of the hospital.    There is a wave flag banner located near the entrance on [address] Turn into this entrance and immediately turn left and park in 1 of the 5 designated Covid Infusion Parking spots. There is a phone number on the sign, please call and let the staff know what spot you are in and we will come out and get you. For questions call [phone].  Thanks.  

## 2019-09-19 NOTE — Discharge Instructions (Addendum)
Patient scheduled for outpatient Remdesivir infusions at 11am on Saturday 9/11 and Sunday 9/12 at [facility]. Please inform the patient to park at [address], [place], as staff will be escorting the patient through the east entrance of the hospital.    There is a wave flag banner located near the entrance on [address] Turn into this entrance and immediately turn left and park in 1 of the 5 designated Covid Infusion Parking spots. There is a phone number on the sign, please call and let the staff know what spot you are in and we will come out and get you. For questions call [phone].  Thanks.  

## 2019-09-20 ENCOUNTER — Ambulatory Visit (HOSPITAL_COMMUNITY)
Admit: 2019-09-20 | Discharge: 2019-09-20 | Disposition: A | Discharge status: Home / Self Care | Payer: Medicare HMO | Attending: Pulmonary Disease | Admitting: Pulmonary Disease

## 2019-09-20 DIAGNOSIS — U071 COVID-19: Secondary | ICD-10-CM | POA: Insufficient documentation

## 2019-09-20 MED ORDER — SODIUM CHLORIDE 0.9 % IV SOLN
100.0000 mg | Freq: Once | INTRAVENOUS | Status: AC
  Administered 2019-09-20: 100 mg via INTRAVENOUS
  Filled 2019-09-20: qty 20

## 2019-09-20 MED ORDER — DIPHENHYDRAMINE HCL 50 MG/ML IJ SOLN: 50.0000 mg | Freq: Once | INTRAMUSCULAR | Status: DC | PRN

## 2019-09-20 MED ORDER — METHYLPREDNISOLONE SODIUM SUCC 125 MG IJ SOLR: 125.0000 mg | Freq: Once | INTRAMUSCULAR | Status: DC | PRN

## 2019-09-20 MED ORDER — FAMOTIDINE IN NACL 20-0.9 MG/50ML-% IV SOLN: 20.0000 mg | Freq: Once | INTRAVENOUS | Status: DC | PRN

## 2019-09-20 MED ORDER — SODIUM CHLORIDE 0.9 % IV SOLN: INTRAVENOUS | Status: DC | PRN

## 2019-09-20 MED ORDER — EPINEPHRINE 0.3 MG/0.3ML IJ SOAJ: 0.3000 mg | Freq: Once | INTRAMUSCULAR | Status: DC | PRN

## 2019-09-20 MED ORDER — ALBUTEROL SULFATE HFA 108 (90 BASE) MCG/ACT IN AERS: 2.0000 | INHALATION_SPRAY | Freq: Once | RESPIRATORY_TRACT | Status: DC | PRN

## 2019-09-20 NOTE — Progress Notes (Signed)
  Diagnosis: COVID-19  Physician: Dr Joya Gaskins  Procedure: Covid Infusion Clinic Med: remdesivir infusion - Provided patient with remdesivir fact sheet for patients, parents and caregivers prior to infusion.  Complications: No immediate complications noted.  Discharge: Discharged home   Kimberly Livingston 09/20/2019

## 2019-09-20 NOTE — Discharge Instructions (Signed)
10 Things You Can Do to Manage Your COVID-19 Symptoms at Home If you have possible or confirmed COVID-19: 1. Stay home from work and school. And stay away from other public places. If you must go out, avoid using any kind of public transportation, ridesharing, or taxis. 2. Monitor your symptoms carefully. If your symptoms get worse, call your healthcare provider immediately. 3. Get rest and stay hydrated. 4. If you have a medical appointment, call the healthcare provider ahead of time and tell them that you have or may have COVID-19. 5. For medical emergencies, call 911 and notify the dispatch personnel that you have or may have COVID-19. 6. Cover your cough and sneezes with a tissue or use the inside of your elbow. 7. Wash your hands often with soap and water for at least 20 seconds or clean your hands with an alcohol-based hand sanitizer that contains at least 60% alcohol. 8. As much as possible, stay in a specific room and away from other people in your home. Also, you should use a separate bathroom, if available. If you need to be around other people in or outside of the home, wear a mask. 9. Avoid sharing personal items with other people in your household, like dishes, towels, and bedding. 10. Clean all surfaces that are touched often, like counters, tabletops, and doorknobs. Use household cleaning sprays or wipes according to the label instructions. cdc.gov/coronavirus 07/10/2018 This information is not intended to replace advice given to you by your health care provider. Make sure you discuss any questions you have with your health care provider. Document Revised: 12/12/2018 Document Reviewed: 12/12/2018 Elsevier Patient Education  2020 Elsevier Inc.  

## 2019-09-21 ENCOUNTER — Ambulatory Visit (HOSPITAL_COMMUNITY)
Admit: 2019-09-21 | Discharge: 2019-09-21 | Disposition: A | Discharge status: Home / Self Care | Payer: Medicare HMO | Source: Ambulatory Visit | Attending: Pulmonary Disease | Admitting: Pulmonary Disease

## 2019-09-21 DIAGNOSIS — J1282 Pneumonia due to coronavirus disease 2019: Secondary | ICD-10-CM | POA: Diagnosis not present

## 2019-09-21 DIAGNOSIS — U071 COVID-19: Secondary | ICD-10-CM | POA: Insufficient documentation

## 2019-09-21 MED ORDER — FAMOTIDINE IN NACL 20-0.9 MG/50ML-% IV SOLN: 20.0000 mg | Freq: Once | INTRAVENOUS | Status: DC | PRN

## 2019-09-21 MED ORDER — EPINEPHRINE 0.3 MG/0.3ML IJ SOAJ: 0.3000 mg | Freq: Once | INTRAMUSCULAR | Status: DC | PRN

## 2019-09-21 MED ORDER — DIPHENHYDRAMINE HCL 50 MG/ML IJ SOLN: 50.0000 mg | Freq: Once | INTRAMUSCULAR | Status: DC | PRN

## 2019-09-21 MED ORDER — ALBUTEROL SULFATE HFA 108 (90 BASE) MCG/ACT IN AERS: 2.0000 | INHALATION_SPRAY | Freq: Once | RESPIRATORY_TRACT | Status: DC | PRN

## 2019-09-21 MED ORDER — SODIUM CHLORIDE 0.9 % IV SOLN: INTRAVENOUS | Status: DC | PRN

## 2019-09-21 MED ORDER — SODIUM CHLORIDE 0.9 % IV SOLN
100.0000 mg | Freq: Once | INTRAVENOUS | Status: AC
  Administered 2019-09-21: 100 mg via INTRAVENOUS
  Filled 2019-09-21: qty 20

## 2019-09-21 MED ORDER — METHYLPREDNISOLONE SODIUM SUCC 125 MG IJ SOLR: 125.0000 mg | Freq: Once | INTRAMUSCULAR | Status: DC | PRN

## 2019-09-21 NOTE — Progress Notes (Signed)
Pt presented with respirations of 24 and O2 between 64 and84 on RA. Applied nasal cannula at 4L. O2 sat 94. Decreased to 2L, O2 94. Pt stated uses oxygen at home and educated regarding carrying O2 with her. Pt verbalizes understanding.

## 2019-09-21 NOTE — Progress Notes (Signed)
  Diagnosis: COVID-19  Physician: Dr. Wright  Procedure: Covid Infusion Clinic Med: remdesivir infusion - Provided patient with remdesivir fact sheet for patients, parents and caregivers prior to infusion.  Complications: No immediate complications noted.  Discharge: Discharged home   Kyriaki Moder M Lulani Bour 09/21/2019  

## 2019-09-22 ENCOUNTER — Other Ambulatory Visit: Payer: Self-pay

## 2019-09-22 LAB — CULTURE, BLOOD (ROUTINE X 2)
Culture: NO GROWTH
Culture: NO GROWTH
Special Requests: ADEQUATE
Special Requests: ADEQUATE

## 2019-09-22 NOTE — Patient Outreach (Signed)
[facility]) Care Management  09/22/2019  LIS SAVITT [birth year] [id number]   Referral Date: 09/22/19 Referral Source: [employer] Report Date of Discharge: 09/16/19 Facility:  [place]: [facility]   Referral received.  No outreach warranted at this time.  Transition of Care calls being completed via EMMI. RN CM will outreach patient for any red flags received.    Plan: RN CM will close case.    Jone Baseman, RN, MSN [facility] Care Management Care Management Coordinator Direct Line [phone] Toll Free: [phone]  Fax: [phone]

## 2019-09-23 DIAGNOSIS — U071 COVID-19: Secondary | ICD-10-CM | POA: Diagnosis not present

## 2019-09-23 DIAGNOSIS — J1282 Pneumonia due to coronavirus disease 2019: Secondary | ICD-10-CM | POA: Diagnosis not present

## 2019-09-25 ENCOUNTER — Other Ambulatory Visit: Payer: Self-pay

## 2019-09-25 DIAGNOSIS — R069 Unspecified abnormalities of breathing: Secondary | ICD-10-CM | POA: Diagnosis not present

## 2019-09-25 DIAGNOSIS — R0600 Dyspnea, unspecified: Secondary | ICD-10-CM | POA: Diagnosis not present

## 2019-09-25 DIAGNOSIS — J849 Interstitial pulmonary disease, unspecified: Secondary | ICD-10-CM | POA: Diagnosis not present

## 2019-09-25 DIAGNOSIS — A419 Sepsis, unspecified organism: Secondary | ICD-10-CM | POA: Diagnosis not present

## 2019-09-25 DIAGNOSIS — R0602 Shortness of breath: Secondary | ICD-10-CM | POA: Diagnosis not present

## 2019-09-25 DIAGNOSIS — I2510 Atherosclerotic heart disease of native coronary artery without angina pectoris: Secondary | ICD-10-CM | POA: Diagnosis not present

## 2019-09-25 DIAGNOSIS — J9601 Acute respiratory failure with hypoxia: Secondary | ICD-10-CM | POA: Diagnosis not present

## 2019-09-25 DIAGNOSIS — K760 Fatty (change of) liver, not elsewhere classified: Secondary | ICD-10-CM | POA: Diagnosis not present

## 2019-09-25 DIAGNOSIS — E876 Hypokalemia: Secondary | ICD-10-CM | POA: Diagnosis not present

## 2019-09-25 DIAGNOSIS — R05 Cough: Secondary | ICD-10-CM | POA: Diagnosis not present

## 2019-09-25 DIAGNOSIS — M069 Rheumatoid arthritis, unspecified: Secondary | ICD-10-CM | POA: Diagnosis not present

## 2019-09-25 DIAGNOSIS — U071 COVID-19: Secondary | ICD-10-CM | POA: Diagnosis not present

## 2019-09-25 DIAGNOSIS — I313 Pericardial effusion (noninflammatory): Secondary | ICD-10-CM | POA: Diagnosis not present

## 2019-09-25 DIAGNOSIS — N1832 Chronic kidney disease, stage 3b: Secondary | ICD-10-CM | POA: Diagnosis not present

## 2019-09-25 DIAGNOSIS — R000 Tachycardia, unspecified: Secondary | ICD-10-CM | POA: Diagnosis not present

## 2019-09-25 DIAGNOSIS — R7989 Other specified abnormal findings of blood chemistry: Secondary | ICD-10-CM | POA: Diagnosis not present

## 2019-09-25 DIAGNOSIS — I700 Atherosclerosis of aorta: Secondary | ICD-10-CM | POA: Diagnosis not present

## 2019-09-25 DIAGNOSIS — I10 Essential (primary) hypertension: Secondary | ICD-10-CM | POA: Diagnosis not present

## 2019-09-25 DIAGNOSIS — K219 Gastro-esophageal reflux disease without esophagitis: Secondary | ICD-10-CM | POA: Diagnosis not present

## 2019-09-25 DIAGNOSIS — J9690 Respiratory failure, unspecified, unspecified whether with hypoxia or hypercapnia: Secondary | ICD-10-CM | POA: Diagnosis not present

## 2019-09-25 DIAGNOSIS — J1282 Pneumonia due to coronavirus disease 2019: Secondary | ICD-10-CM | POA: Diagnosis not present

## 2019-09-25 DIAGNOSIS — R6520 Severe sepsis without septic shock: Secondary | ICD-10-CM | POA: Diagnosis not present

## 2019-09-25 DIAGNOSIS — D72829 Elevated white blood cell count, unspecified: Secondary | ICD-10-CM | POA: Diagnosis not present

## 2019-09-25 DIAGNOSIS — J189 Pneumonia, unspecified organism: Secondary | ICD-10-CM | POA: Diagnosis not present

## 2019-09-25 DIAGNOSIS — R0902 Hypoxemia: Secondary | ICD-10-CM | POA: Diagnosis not present

## 2019-09-25 NOTE — Patient Outreach (Signed)
[facility]) Care Management  09/25/2019  Kimberly Livingston Jan 08, 1951 [id number]   EMMI- General Discharge RED ON EMMI ALERT Day # 4 Date:  09/24/19 Red Alert Reason: Scheduled follow-up?   No  Lost interest in things?   Yes  Sad/hopeless/anxious/empty?   Yes     Outreach attempt: spoke with spouse Louie Casa (DPR). He states patient doing ok.  PT to come shortly to see patient.  Addressed red alerts with him. He states patient has appointment with PCP on Monday.  No transportation problems. He states patient is doing ok emotionally to him  Discussed depression and if patient shows signs to address with physician. He verbalized understanding.  Discussed further [facility] CM follow up. He declined at this time.   Plan: RN CM will close case.    Jone Baseman, RN, MSN [facility] Care Management Care Management Coordinator Direct Line [phone] Toll Free: [phone]  Fax: [phone]

## 2019-09-26 DIAGNOSIS — U071 COVID-19: Secondary | ICD-10-CM | POA: Diagnosis not present

## 2019-09-26 DIAGNOSIS — R0902 Hypoxemia: Secondary | ICD-10-CM | POA: Diagnosis not present

## 2019-09-26 DIAGNOSIS — J189 Pneumonia, unspecified organism: Secondary | ICD-10-CM | POA: Diagnosis not present

## 2019-09-26 DIAGNOSIS — R0602 Shortness of breath: Secondary | ICD-10-CM | POA: Diagnosis not present

## 2019-09-27 DIAGNOSIS — A419 Sepsis, unspecified organism: Secondary | ICD-10-CM | POA: Diagnosis not present

## 2019-09-27 DIAGNOSIS — U071 COVID-19: Secondary | ICD-10-CM | POA: Diagnosis not present

## 2019-09-27 DIAGNOSIS — J9601 Acute respiratory failure with hypoxia: Secondary | ICD-10-CM | POA: Diagnosis not present

## 2019-09-28 DIAGNOSIS — U071 COVID-19: Secondary | ICD-10-CM | POA: Diagnosis not present

## 2019-09-28 DIAGNOSIS — A419 Sepsis, unspecified organism: Secondary | ICD-10-CM | POA: Diagnosis not present

## 2019-09-28 DIAGNOSIS — D72829 Elevated white blood cell count, unspecified: Secondary | ICD-10-CM | POA: Diagnosis not present

## 2019-09-28 DIAGNOSIS — J9601 Acute respiratory failure with hypoxia: Secondary | ICD-10-CM | POA: Diagnosis not present

## 2019-09-28 DIAGNOSIS — E876 Hypokalemia: Secondary | ICD-10-CM | POA: Diagnosis not present

## 2019-09-29 DIAGNOSIS — J189 Pneumonia, unspecified organism: Secondary | ICD-10-CM | POA: Diagnosis not present

## 2019-09-29 DIAGNOSIS — R0602 Shortness of breath: Secondary | ICD-10-CM | POA: Diagnosis not present

## 2019-09-29 DIAGNOSIS — R0902 Hypoxemia: Secondary | ICD-10-CM | POA: Diagnosis not present

## 2019-09-29 DIAGNOSIS — U071 COVID-19: Secondary | ICD-10-CM | POA: Diagnosis not present

## 2019-09-30 DIAGNOSIS — R0602 Shortness of breath: Secondary | ICD-10-CM | POA: Diagnosis not present

## 2019-09-30 DIAGNOSIS — J189 Pneumonia, unspecified organism: Secondary | ICD-10-CM | POA: Diagnosis not present

## 2019-09-30 DIAGNOSIS — R0902 Hypoxemia: Secondary | ICD-10-CM | POA: Diagnosis not present

## 2019-09-30 DIAGNOSIS — U071 COVID-19: Secondary | ICD-10-CM | POA: Diagnosis not present

## 2019-10-01 DIAGNOSIS — J9690 Respiratory failure, unspecified, unspecified whether with hypoxia or hypercapnia: Secondary | ICD-10-CM | POA: Diagnosis not present

## 2019-10-01 DIAGNOSIS — R0902 Hypoxemia: Secondary | ICD-10-CM | POA: Diagnosis not present

## 2019-10-01 DIAGNOSIS — J189 Pneumonia, unspecified organism: Secondary | ICD-10-CM | POA: Diagnosis not present

## 2019-10-01 DIAGNOSIS — U071 COVID-19: Secondary | ICD-10-CM | POA: Diagnosis not present

## 2019-10-01 DIAGNOSIS — R0602 Shortness of breath: Secondary | ICD-10-CM | POA: Diagnosis not present

## 2019-10-02 DIAGNOSIS — R0600 Dyspnea, unspecified: Secondary | ICD-10-CM | POA: Diagnosis not present

## 2019-10-02 DIAGNOSIS — R0902 Hypoxemia: Secondary | ICD-10-CM | POA: Diagnosis not present

## 2019-10-02 DIAGNOSIS — R0602 Shortness of breath: Secondary | ICD-10-CM | POA: Diagnosis not present

## 2019-10-02 DIAGNOSIS — U071 COVID-19: Secondary | ICD-10-CM | POA: Diagnosis not present

## 2019-10-02 DIAGNOSIS — J189 Pneumonia, unspecified organism: Secondary | ICD-10-CM | POA: Diagnosis not present

## 2019-10-03 DIAGNOSIS — J189 Pneumonia, unspecified organism: Secondary | ICD-10-CM | POA: Diagnosis not present

## 2019-10-03 DIAGNOSIS — U071 COVID-19: Secondary | ICD-10-CM | POA: Diagnosis not present

## 2019-10-03 DIAGNOSIS — R0902 Hypoxemia: Secondary | ICD-10-CM | POA: Diagnosis not present

## 2019-10-03 DIAGNOSIS — R0602 Shortness of breath: Secondary | ICD-10-CM | POA: Diagnosis not present

## 2019-10-06 ENCOUNTER — Other Ambulatory Visit: Payer: Self-pay

## 2019-10-06 NOTE — Patient Outreach (Signed)
[place] [facility]) Care Management  10/06/2019  Kimberly Livingston May 26, 1950 [id number]   Referral Date:  10/06/19 Referral Source: [employer] Report Date of Discharge: 10/15/2019 Facility:  [facility]   Referral received.  No outreach warranted at this time.  Patient deceased on 15-Oct-2019 at [facility].  Plan: RN CM will close case.    Jone Baseman, RN, MSN [facility] Care Management Care Management Coordinator Direct Line [phone] Toll Free: [phone]  Fax: [phone]

## 2019-10-10 DEATH — deceased

## 2020-12-11 IMAGING — CT CT ANGIO CHEST
2 of 6 series · 18 of 46 positions shown · IV contrast (Omnipaque or Isovue)
Comparison: 07/15/2015

CLINICAL DATA: Shortness of breath and nonproductive cough for 3
weeks. Positive COVID test today.

EXAM:
CT ANGIOGRAPHY CHEST WITH CONTRAST
TECHNIQUE: Multidetector CT imaging of the chest was performed using the
standard protocol during bolus administration of intravenous
contrast. Multiplanar CT image reconstructions and MIPs were
obtained to evaluate the vascular anatomy.
CONTRAST:  65mL OMNIPAQUE IOHEXOL 350 MG/ML SOLN

[Series 5: pe axial thins · axial · 0.61mm/px · z∈[+1051,+1320]mm · 15 of 295 slices shown]
[im 13/295  lung]
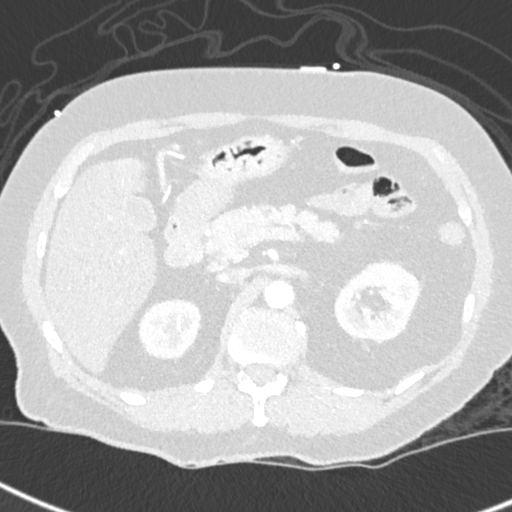
[im 39/295  soft-tissue]
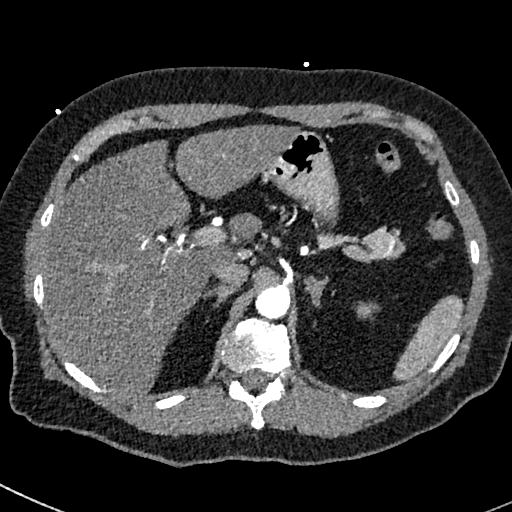
[im 52/295  lung]
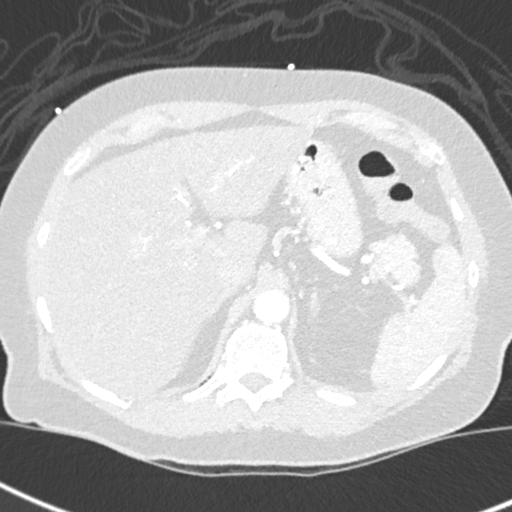
[im 77/295  soft-tissue]
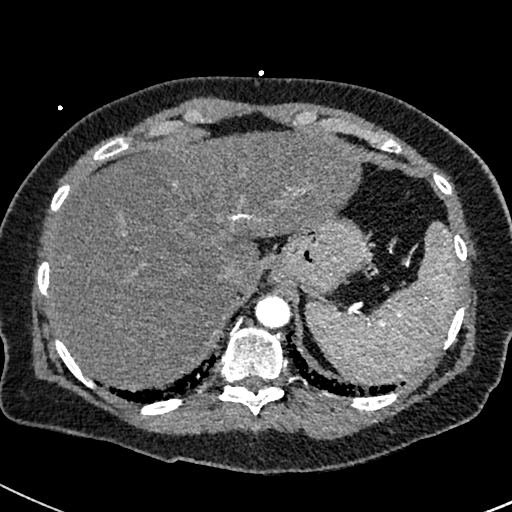
[im 90/295  lung]
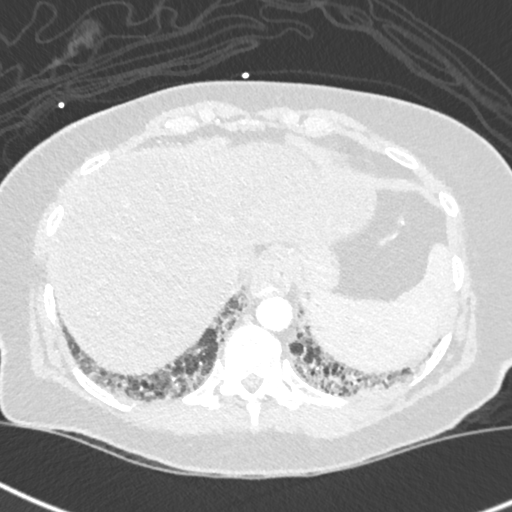
[im 116/295  soft-tissue]
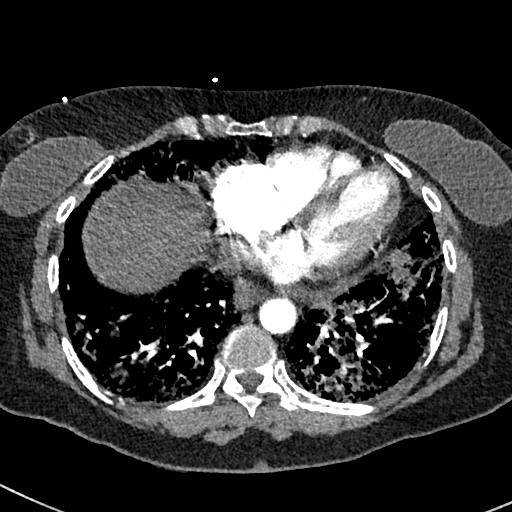
[im 128/295  lung]
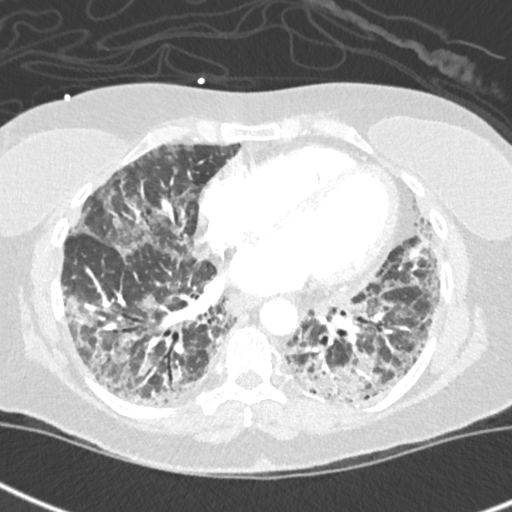
[im 154/295  soft-tissue]
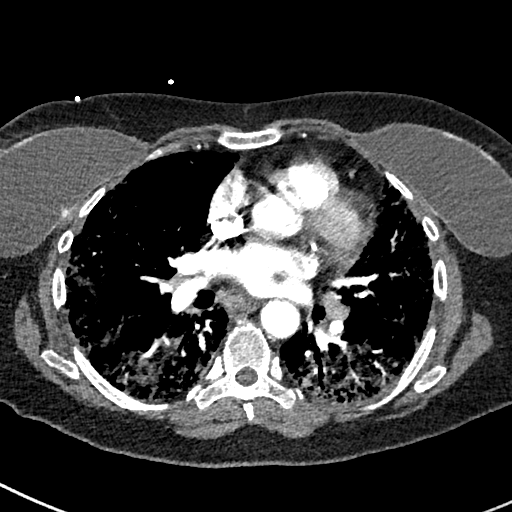
[im 167/295  lung]
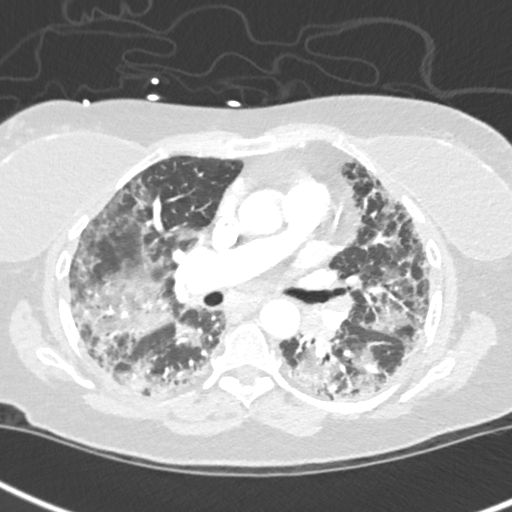
[im 179/295  soft-tissue]
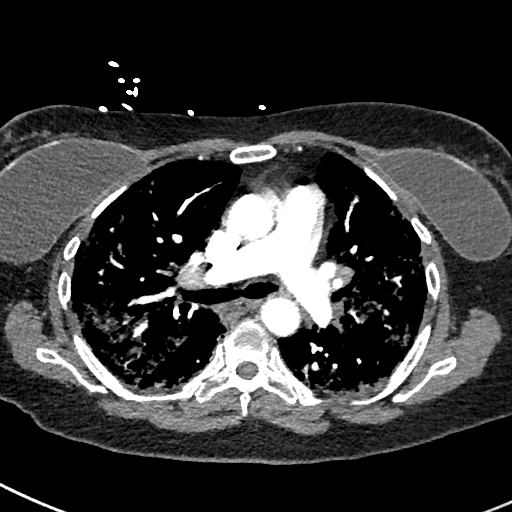
[im 205/295  lung]
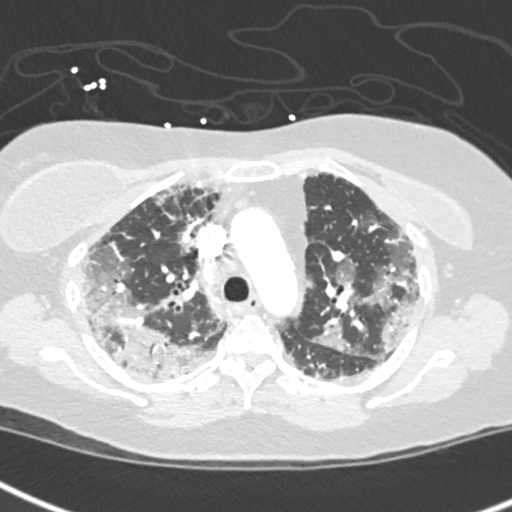
[im 218/295  soft-tissue]
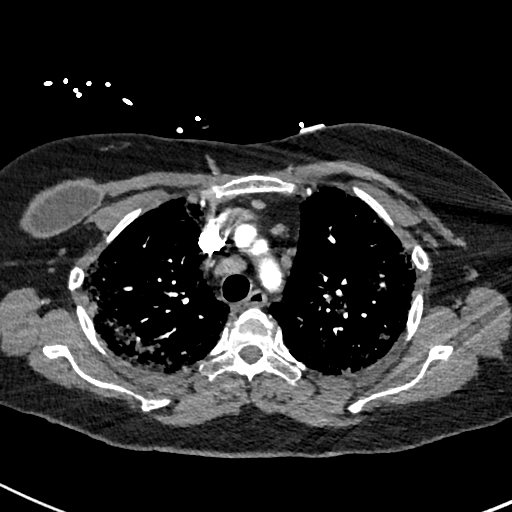
[im 243/295  lung]
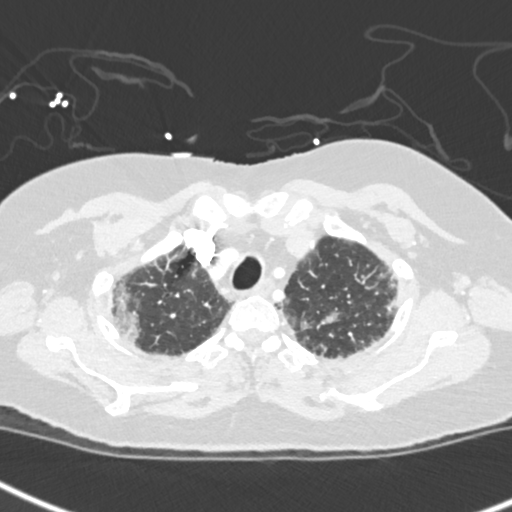
[im 256/295  soft-tissue]
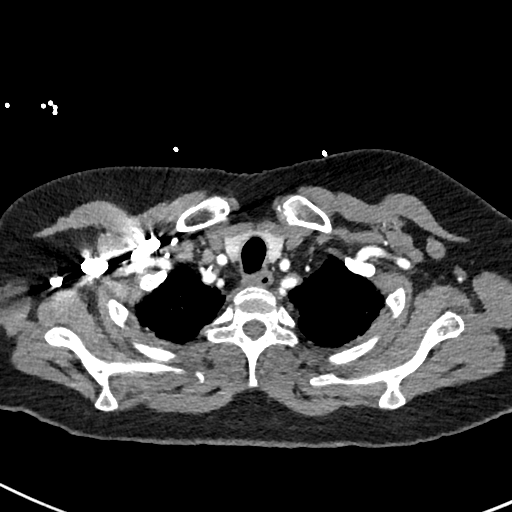
[im 282/295  lung]
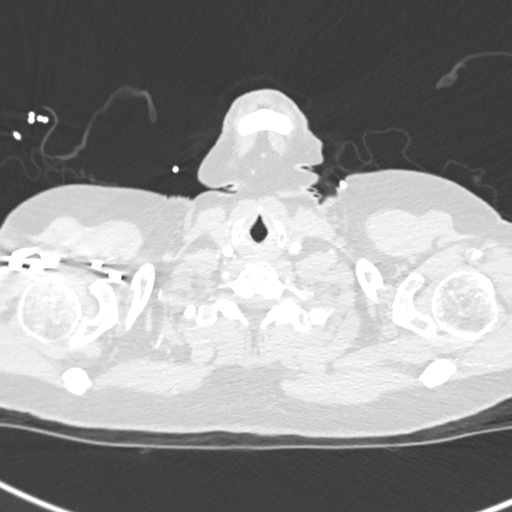

[Series 7: cor soft · coronal · 0.59mm/px · 3 of 115 slices shown]
[im 29/115  soft-tissue]
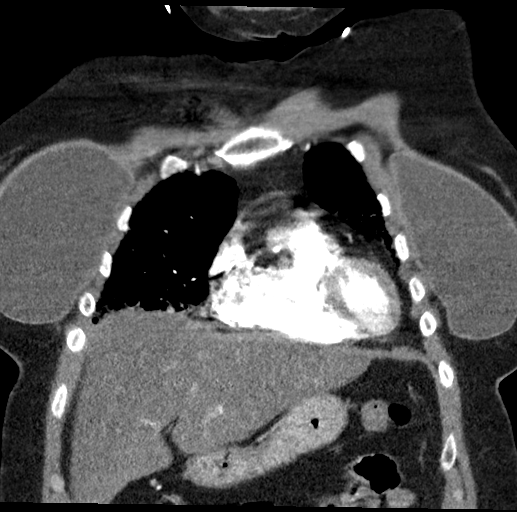
[im 58/115  soft-tissue]
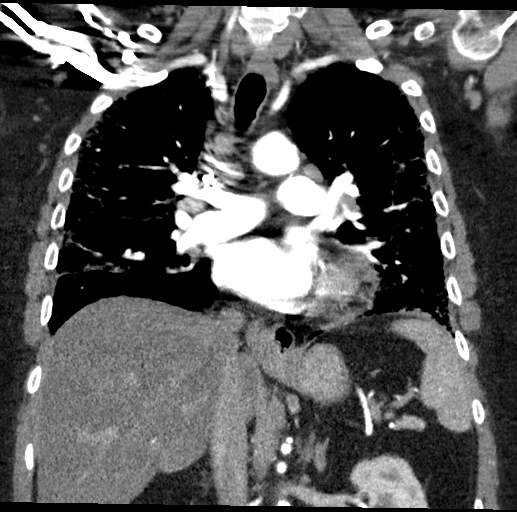
[im 86/115  soft-tissue]
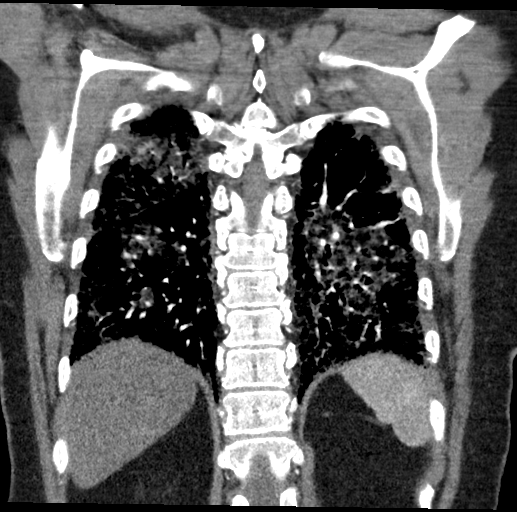

[18 of 46 positions shown; findings below may reference images not displayed]

FINDINGS: Cardiovascular: The heart is borderline enlarged. No pericardial
effusion. Mild tortuosity and calcification of the thoracic aorta
but no focal aneurysm or dissection. Three-vessel coronary artery
calcifications are noted.

The pulmonary arterial tree is well opacified. No filling defects to
suggest pulmonary embolism.

Mediastinum/Nodes: Mediastinal and hilar lymphadenopathy likely
related to the patient's COVID pneumonia. The esophagus is grossly
normal.

Lungs/Pleura: Extensive patchy largely peripheral ground-glass
infiltrates consistent with extensive COVID pneumonia. No pleural
effusions.

Upper Abdomen: No significant upper abdominal findings. There is
diffuse and fairly marked fatty infiltration of the liver noted.
Small hiatal hernia noted.

Musculoskeletal: Bilateral breast prostheses are noted. No breast
masses or supraclavicular adenopathy. The thyroid gland is grossly
normal. Borderline left axillary lymph node at 9 mm. No significant
bony findings.

Review of the MIP images confirms the above findings.
IMPRESSION: 1. No CT findings for pulmonary embolism.
2. Mild tortuosity and calcification of the thoracic aorta but no
focal aneurysm or dissection.
3. Three-vessel coronary artery calcifications.
4. Extensive patchy, largely peripheral, ground-glass infiltrates
consistent with extensive COVID pneumonia.
5. Mediastinal and hilar lymphadenopathy, likely related to the
patient's COVID pneumonia.
6. Diffuse and fairly marked fatty infiltration of the liver.
7. Aortic atherosclerosis.

Aortic Atherosclerosis ([id number]).

## 2020-12-11 IMAGING — DX DG CHEST 1V PORT
1 series · 1 of 1 positions shown · non-contrast
Comparison: 07/15/2015

CLINICAL DATA: Hypoxia, sickness for 3 weeks

EXAM:
PORTABLE CHEST 1 VIEW

[chest ap]
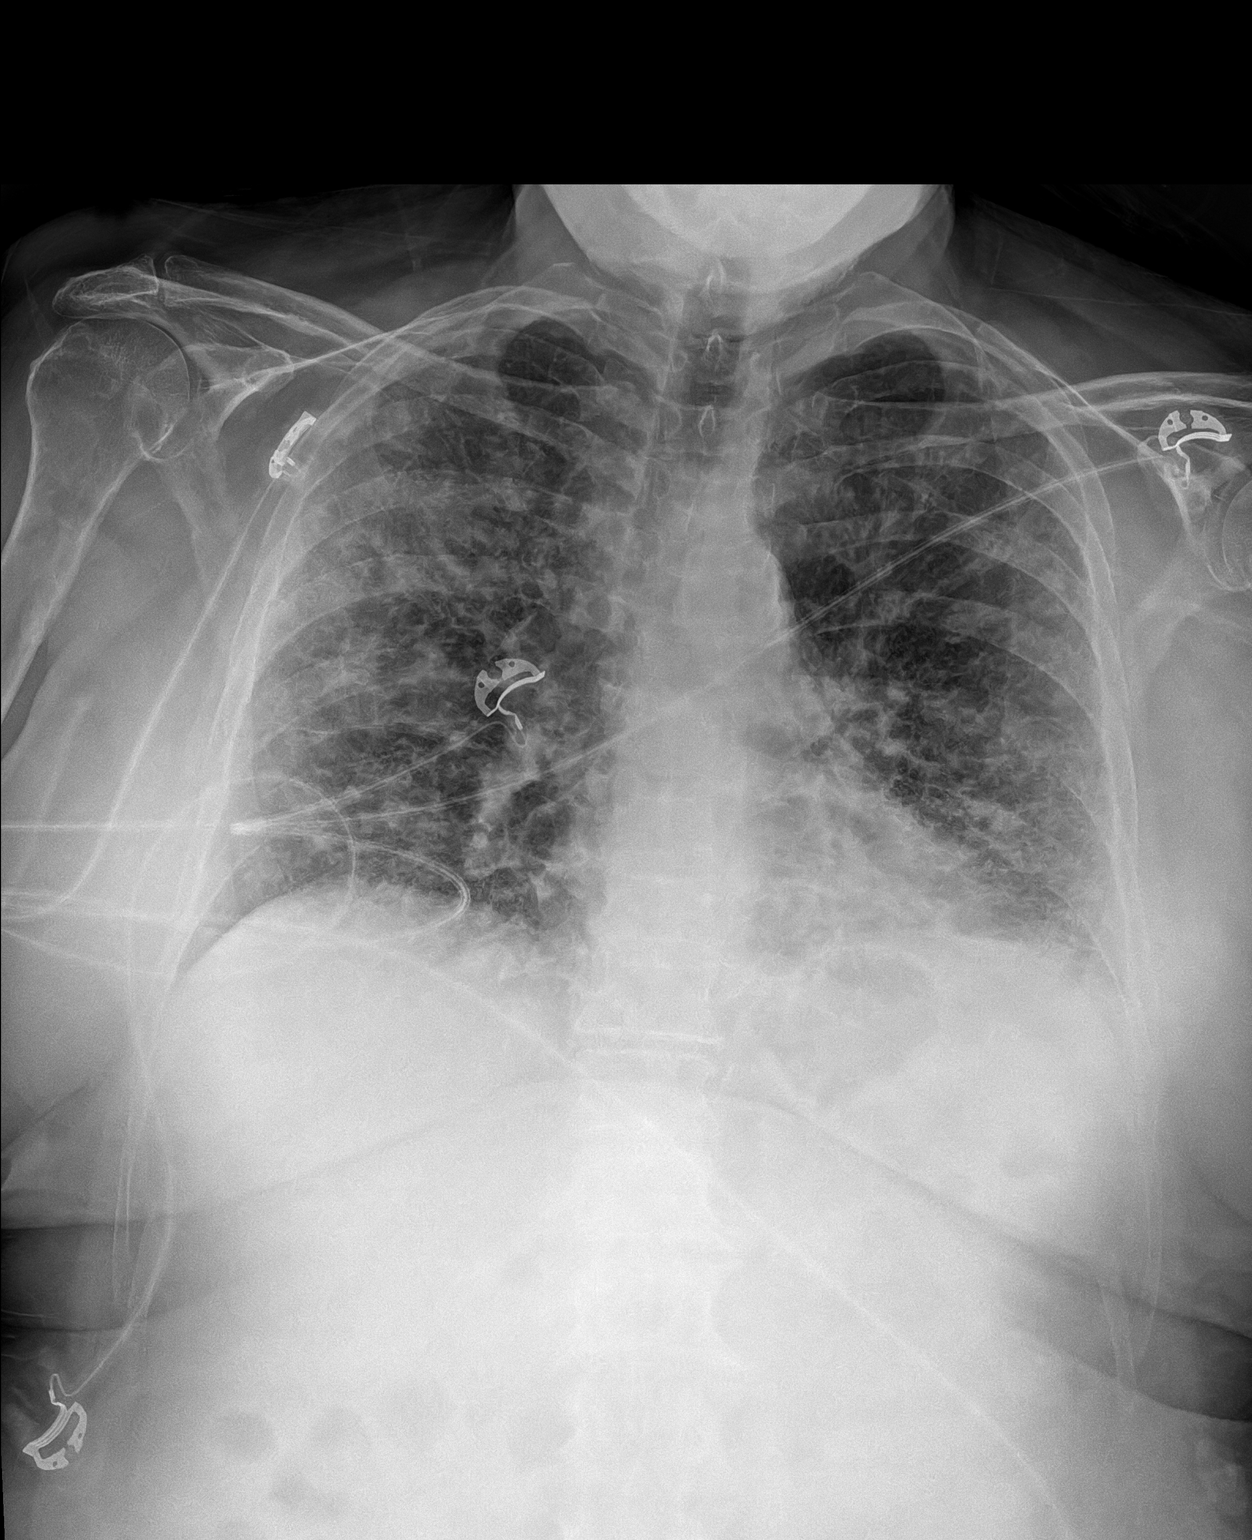

[1 of 1 positions shown; findings below may reference images not displayed]

FINDINGS: Single frontal view of the chest demonstrates a stable cardiac
silhouette. There are multifocal bilateral areas of airspace
disease, superimposed upon chronic interstitial scarring. No
effusion or pneumothorax. No acute bony abnormalities.
IMPRESSION: 1. Multifocal bilateral airspace disease, favor atypical infection
over edema. Pattern is consistent with [id number].

## 2024-01-24 ENCOUNTER — Encounter (INDEPENDENT_AMBULATORY_CARE_PROVIDER_SITE_OTHER): Payer: Self-pay | Admitting: *Deleted
